# Patient Record
Sex: Female | Born: 1970 | Race: Black or African American | Hispanic: No | Marital: Single | Smoking: Current every day smoker
Health system: Southern US, Community
[De-identification: ages and names within clinical notes are randomized; demographics above are authoritative.]

## PROBLEM LIST (undated history)

## (undated) DIAGNOSIS — R0789 Other chest pain: Secondary | ICD-10-CM

## (undated) DIAGNOSIS — I10 Essential (primary) hypertension: Secondary | ICD-10-CM

## (undated) DIAGNOSIS — Z720 Tobacco use: Secondary | ICD-10-CM

## (undated) DIAGNOSIS — E785 Hyperlipidemia, unspecified: Secondary | ICD-10-CM

## (undated) DIAGNOSIS — I5189 Other ill-defined heart diseases: Secondary | ICD-10-CM

## (undated) HISTORY — DX: Tobacco use: Z72.0

## (undated) HISTORY — DX: Other chest pain: R07.89

## (undated) HISTORY — DX: Other ill-defined heart diseases: I51.89

---

## 2004-11-01 ENCOUNTER — Emergency Department: Payer: Self-pay | Admitting: General Practice

## 2005-01-22 ENCOUNTER — Observation Stay: Payer: Self-pay | Admitting: Obstetrics & Gynecology

## 2005-03-17 ENCOUNTER — Observation Stay: Payer: Self-pay | Admitting: Unknown Physician Specialty

## 2005-04-15 ENCOUNTER — Observation Stay: Payer: Self-pay

## 2005-04-24 ENCOUNTER — Observation Stay: Payer: Self-pay

## 2005-05-14 ENCOUNTER — Observation Stay: Payer: Self-pay | Admitting: Obstetrics & Gynecology

## 2005-05-15 ENCOUNTER — Inpatient Hospital Stay: Payer: Self-pay

## 2006-10-22 ENCOUNTER — Emergency Department: Payer: Self-pay | Admitting: Emergency Medicine

## 2011-09-16 ENCOUNTER — Ambulatory Visit: Payer: Self-pay

## 2013-07-17 ENCOUNTER — Emergency Department: Payer: Self-pay | Admitting: Emergency Medicine

## 2013-07-17 LAB — CBC WITH DIFFERENTIAL/PLATELET
BASOS PCT: 0.5 %
Basophil #: 0 10*3/uL (ref 0.0–0.1)
EOS PCT: 0.2 %
Eosinophil #: 0 10*3/uL (ref 0.0–0.7)
HCT: 44.9 % (ref 35.0–47.0)
HGB: 15 g/dL (ref 12.0–16.0)
LYMPHS PCT: 41.8 %
Lymphocyte #: 2.2 10*3/uL (ref 1.0–3.6)
MCH: 30.5 pg (ref 26.0–34.0)
MCHC: 33.5 g/dL (ref 32.0–36.0)
MCV: 91 fL (ref 80–100)
Monocyte #: 0.7 x10 3/mm (ref 0.2–0.9)
Monocyte %: 12.6 %
NEUTROS ABS: 2.4 10*3/uL (ref 1.4–6.5)
NEUTROS PCT: 44.9 %
PLATELETS: 122 10*3/uL — AB (ref 150–440)
RBC: 4.93 10*6/uL (ref 3.80–5.20)
RDW: 13.7 % (ref 11.5–14.5)
WBC: 5.4 10*3/uL (ref 3.6–11.0)

## 2013-07-17 LAB — URINALYSIS, COMPLETE
Bilirubin,UR: NEGATIVE
Glucose,UR: NEGATIVE mg/dL (ref 0–75)
Ketone: NEGATIVE
Leukocyte Esterase: NEGATIVE
Nitrite: POSITIVE
PH: 5 (ref 4.5–8.0)
Protein: 100
Specific Gravity: 1.014 (ref 1.003–1.030)
Squamous Epithelial: 3

## 2013-07-17 LAB — COMPREHENSIVE METABOLIC PANEL
ALBUMIN: 3.7 g/dL (ref 3.4–5.0)
ALK PHOS: 45 U/L
AST: 27 U/L (ref 15–37)
Anion Gap: 2 — ABNORMAL LOW (ref 7–16)
BUN: 5 mg/dL — ABNORMAL LOW (ref 7–18)
Bilirubin,Total: 0.6 mg/dL (ref 0.2–1.0)
CALCIUM: 8.3 mg/dL — AB (ref 8.5–10.1)
CO2: 30 mmol/L (ref 21–32)
Chloride: 104 mmol/L (ref 98–107)
Creatinine: 0.76 mg/dL (ref 0.60–1.30)
EGFR (Non-African Amer.): >60
Glucose: 98 mg/dL (ref 65–99)
Osmolality: 269 (ref 275–301)
Potassium: 3.5 mmol/L (ref 3.5–5.1)
SGPT (ALT): 17 U/L (ref 12–78)
Sodium: 136 mmol/L (ref 136–145)
Total Protein: 7.7 g/dL (ref 6.4–8.2)

## 2013-07-17 LAB — LIPASE, BLOOD: LIPASE: 158 U/L (ref 73–393)

## 2013-07-20 LAB — URINE CULTURE

## 2013-11-10 ENCOUNTER — Emergency Department: Payer: Self-pay | Admitting: Emergency Medicine

## 2013-11-10 LAB — URINALYSIS, COMPLETE
Bilirubin,UR: NEGATIVE
Blood: NEGATIVE
Glucose,UR: NEGATIVE mg/dL (ref 0–75)
KETONE: NEGATIVE
LEUKOCYTE ESTERASE: NEGATIVE
Nitrite: NEGATIVE
PH: 6 (ref 4.5–8.0)
PROTEIN: NEGATIVE
RBC,UR: NONE SEEN /HPF (ref 0–5)
SPECIFIC GRAVITY: 1.014 (ref 1.003–1.030)
WBC UR: 4 /HPF (ref 0–5)

## 2013-11-10 LAB — CBC
HCT: 38.2 % (ref 35.0–47.0)
HGB: 12.9 g/dL (ref 12.0–16.0)
MCH: 30.7 pg (ref 26.0–34.0)
MCHC: 33.7 g/dL (ref 32.0–36.0)
MCV: 91 fL (ref 80–100)
Platelet: 215 10*3/uL (ref 150–440)
RBC: 4.19 10*6/uL (ref 3.80–5.20)
RDW: 14.2 % (ref 11.5–14.5)
WBC: 7.6 10*3/uL (ref 3.6–11.0)

## 2013-11-10 LAB — COMPREHENSIVE METABOLIC PANEL
ALBUMIN: 3.6 g/dL (ref 3.4–5.0)
Alkaline Phosphatase: 99 U/L
Anion Gap: 5 — ABNORMAL LOW (ref 7–16)
BUN: 8 mg/dL (ref 7–18)
Bilirubin,Total: 0.5 mg/dL (ref 0.2–1.0)
CO2: 27 mmol/L (ref 21–32)
Calcium, Total: 8.7 mg/dL (ref 8.5–10.1)
Chloride: 108 mmol/L — ABNORMAL HIGH (ref 98–107)
Creatinine: 0.93 mg/dL (ref 0.60–1.30)
EGFR (African American): >60
EGFR (Non-African Amer.): >60
GLUCOSE: 69 mg/dL (ref 65–99)
OSMOLALITY: 276 (ref 275–301)
Potassium: 3.8 mmol/L (ref 3.5–5.1)
SGOT(AST): 23 U/L (ref 15–37)
SGPT (ALT): 18 U/L (ref 12–78)
Sodium: 140 mmol/L (ref 136–145)
TOTAL PROTEIN: 6.9 g/dL (ref 6.4–8.2)

## 2013-11-10 LAB — PREGNANCY, URINE: PREGNANCY TEST, URINE: NEGATIVE m[IU]/mL

## 2013-11-10 LAB — WET PREP, GENITAL

## 2013-11-10 LAB — GC/CHLAMYDIA PROBE AMP

## 2013-11-10 LAB — LIPASE, BLOOD: Lipase: 176 U/L (ref 73–393)

## 2015-06-20 ENCOUNTER — Emergency Department
Admission: EM | Admit: 2015-06-20 | Discharge: 2015-06-20 | Disposition: A | Discharge status: Home / Self Care | Payer: BLUE CROSS/BLUE SHIELD | Attending: Emergency Medicine | Admitting: Emergency Medicine

## 2015-06-20 ENCOUNTER — Encounter: Payer: Self-pay | Admitting: Emergency Medicine

## 2015-06-20 DIAGNOSIS — I10 Essential (primary) hypertension: Secondary | ICD-10-CM | POA: Diagnosis not present

## 2015-06-20 DIAGNOSIS — F17210 Nicotine dependence, cigarettes, uncomplicated: Secondary | ICD-10-CM | POA: Diagnosis not present

## 2015-06-20 DIAGNOSIS — R251 Tremor, unspecified: Secondary | ICD-10-CM | POA: Diagnosis present

## 2015-06-20 DIAGNOSIS — F419 Anxiety disorder, unspecified: Secondary | ICD-10-CM | POA: Insufficient documentation

## 2015-06-20 LAB — BASIC METABOLIC PANEL
Anion gap: 3 — ABNORMAL LOW (ref 5–15)
BUN: 9 mg/dL (ref 6–20)
CO2: 29 mmol/L (ref 22–32)
Calcium: 9.2 mg/dL (ref 8.9–10.3)
Chloride: 107 mmol/L (ref 101–111)
Creatinine, Ser: 0.6 mg/dL (ref 0.44–1.00)
GFR calc Af Amer: >60 mL/min (ref >60)
GLUCOSE: 82 mg/dL (ref 65–99)
POTASSIUM: 3.6 mmol/L (ref 3.5–5.1)
Sodium: 139 mmol/L (ref 135–145)

## 2015-06-20 LAB — URINALYSIS COMPLETE WITH MICROSCOPIC (ARMC ONLY)
BILIRUBIN URINE: NEGATIVE
Glucose, UA: NEGATIVE mg/dL
KETONES UR: NEGATIVE mg/dL
NITRITE: NEGATIVE
Protein, ur: NEGATIVE mg/dL
Specific Gravity, Urine: 1.01 (ref 1.005–1.030)
pH: 6 (ref 5.0–8.0)

## 2015-06-20 LAB — CBC
HEMATOCRIT: 39 % (ref 35.0–47.0)
Hemoglobin: 13 g/dL (ref 12.0–16.0)
MCH: 30.3 pg (ref 26.0–34.0)
MCHC: 33.4 g/dL (ref 32.0–36.0)
MCV: 90.5 fL (ref 80.0–100.0)
Platelets: 215 10*3/uL (ref 150–440)
RBC: 4.31 MIL/uL (ref 3.80–5.20)
RDW: 13.5 % (ref 11.5–14.5)
WBC: 6.5 10*3/uL (ref 3.6–11.0)

## 2015-06-20 LAB — GLUCOSE, CAPILLARY: GLUCOSE-CAPILLARY: 121 mg/dL — AB (ref 65–99)

## 2015-06-20 MED ORDER — MIRTAZAPINE 15 MG PO TABS: 15.0000 mg | ORAL_TABLET | Freq: Every day | ORAL | Status: DC

## 2015-06-20 MED ORDER — HYDROCHLOROTHIAZIDE 25 MG PO TABS: 25.0000 mg | ORAL_TABLET | Freq: Every day | ORAL | Status: DC

## 2015-06-20 NOTE — ED Notes (Signed)
Today at work ([employer]) states she felt light-headed and started shaking uncontrollably. Patient questioning if it could be her nerves or her blood pressure. States she still feels light headed but the shakiness is going.  States "I don't feel good I'm not sure what it is".

## 2015-06-20 NOTE — Discharge Instructions (Signed)
Panic Attacks °Panic attacks are sudden, short-lived surges of severe anxiety, fear, or discomfort. They may occur for no reason when you are relaxed, when you are anxious, or when you are sleeping. Panic attacks may occur for a number of reasons:  °· Healthy people occasionally have panic attacks in extreme, life-threatening situations, such as war or natural disasters. Normal anxiety is a protective mechanism of the body that helps us react to danger (fight or flight response). °· Panic attacks are often seen with anxiety disorders, such as panic disorder, social anxiety disorder, generalized anxiety disorder, and phobias. Anxiety disorders cause excessive or uncontrollable anxiety. They may interfere with your relationships or other life activities. °· Panic attacks are sometimes seen with other mental illnesses, such as depression and posttraumatic stress disorder. °· Certain medical conditions, prescription medicines, and drugs of abuse can cause panic attacks. °SYMPTOMS  °Panic attacks start suddenly, peak within 20 minutes, and are accompanied by four or more of the following symptoms: °· Pounding heart or fast heart rate (palpitations). °· Sweating. °· Trembling or shaking. °· Shortness of breath or feeling smothered. °· Feeling choked. °· Chest pain or discomfort. °· Nausea or strange feeling in your stomach. °· Dizziness, light-headedness, or feeling like you will faint. °· Chills or hot flushes. °· Numbness or tingling in your lips or hands and feet. °· Feeling that things are not real or feeling that you are not yourself. °· Fear of losing control or going crazy. °· Fear of dying. °Some of these symptoms can mimic serious medical conditions. For example, you may think you are having a heart attack. Although panic attacks can be very scary, they are not life threatening. °DIAGNOSIS  °Panic attacks are diagnosed through an assessment by your health care provider. Your health care provider will ask  questions about your symptoms, such as where and when they occurred. Your health care provider will also ask about your medical history and use of alcohol and drugs, including prescription medicines. Your health care provider may order blood tests or other studies to rule out a serious medical condition. Your health care provider may refer you to a mental health professional for further evaluation. °TREATMENT  °· Most healthy people who have one or two panic attacks in an extreme, life-threatening situation will not require treatment. °· The treatment for panic attacks associated with anxiety disorders or other mental illness typically involves counseling with a mental health professional, medicine, or a combination of both. Your health care provider will help determine what treatment is best for you. °· Panic attacks due to physical illness usually go away with treatment of the illness. If prescription medicine is causing panic attacks, talk with your health care provider about stopping the medicine, decreasing the dose, or substituting another medicine. °· Panic attacks due to alcohol or drug abuse go away with abstinence. Some adults need professional help in order to stop drinking or using drugs. °HOME CARE INSTRUCTIONS  °· Take all medicines as directed by your health care provider.   °· Schedule and attend follow-up visits as directed by your health care provider. It is important to keep all your appointments. °SEEK MEDICAL CARE IF: °· You are not able to take your medicines as prescribed. °· Your symptoms do not improve or get worse. °SEEK IMMEDIATE MEDICAL CARE IF:  °· You experience panic attack symptoms that are different than your usual symptoms. °· You have serious thoughts about hurting yourself or others. °· You are taking medicine for panic attacks and   have a serious side effect. MAKE SURE YOU:  Understand these instructions.  Will watch your condition.  Will get help right away if you are not  doing well or get worse.   This information is not intended to replace advice given to you by your health care provider. Make sure you discuss any questions you have with your health care provider.   Document Released: 05/04/2005 Document Revised: 05/09/2013 Document Reviewed: 12/16/2012 Elsevier Interactive Patient Education 2016 [employer].  Hypertension Hypertension is another name for high blood pressure. High blood pressure forces your heart to work harder to pump blood. A blood pressure reading has two numbers, which includes a higher number over a lower number (example: 110/72). HOME CARE   Have your blood pressure rechecked by your doctor.  Only take medicine as told by your doctor. Follow the directions carefully. The medicine does not work as well if you skip doses. Skipping doses also puts you at risk for problems.  Do not smoke.  Monitor your blood pressure at home as told by your doctor. GET HELP IF:  You think you are having a reaction to the medicine you are taking.  You have repeat headaches or feel dizzy.  You have puffiness (swelling) in your ankles.  You have trouble with your vision. GET HELP RIGHT AWAY IF:   You get a very bad headache and are confused.  You feel weak, numb, or faint.  You get chest or belly (abdominal) pain.  You throw up (vomit).  You cannot breathe very well. MAKE SURE YOU:   Understand these instructions.  Will watch your condition.  Will get help right away if you are not doing well or get worse.   This information is not intended to replace advice given to you by your health care provider. Make sure you discuss any questions you have with your health care provider.   Document Released: 10/21/2007 Document Revised: 05/09/2013 Document Reviewed: 02/24/2013 Elsevier Interactive Patient Education [employer]

## 2015-06-20 NOTE — ED Notes (Signed)
FSBS 121

## 2015-06-20 NOTE — ED Provider Notes (Signed)
[facility] Emergency Department Provider Note     Time seen: ----------------------------------------- 2:45 PM on 06/20/2015 -----------------------------------------    I have reviewed the triage vital signs and the nursing notes.   HISTORY  Chief Complaint Shaking    HPI Ana Cannon is a 45 y.o. female who presents ER for lightheadedness and shaking at work. Patient states it could be her nerves or blood pressure. Shestates she felt lightheaded but the shakiness was going away. Patient states she just didn't feel the patient is not sure what exactly was wrong. She does note being under increased stress due to financial concerns, denies any recent illness.   History reviewed. No pertinent past medical history.  There are no active problems to display for this patient.   History reviewed. No pertinent past surgical history.  Allergies Review of patient's allergies indicates no known allergies.  Social History Social History  Substance Use Topics  . Smoking status: Current Every Day Smoker -- 0.50 packs/day    Types: Cigarettes  . Smokeless tobacco: None  . Alcohol Use: No    Review of Systems Constitutional: Negative for fever. Eyes: Negative for visual changes. ENT: Negative for sore throat. Cardiovascular: Negative for chest pain. Respiratory: Negative for shortness of breath. Gastrointestinal: Negative for abdominal pain, vomiting and diarrhea. Genitourinary: Negative for dysuria. Musculoskeletal: Negative for back pain. Skin: Negative for rash. Neurological: Negative for headaches, focal weakness or numbness. Positive for dizziness  10-point ROS otherwise negative.  ____________________________________________   PHYSICAL EXAM:  VITAL SIGNS: ED Triage Vitals  Enc Vitals Group     BP 06/20/15 1122 144/93 mmHg     Pulse Rate 06/20/15 1122 83     Resp --      Temp 06/20/15 1122 98.5 F (36.9 C)     Temp Source  06/20/15 1122 Oral     SpO2 06/20/15 1122 100 %     Weight 06/20/15 1122 128 lb (58.06 kg)     Height 06/20/15 1122  (1.702 m)     Head Cir --      Peak Flow --      Pain Score --      Pain Loc --      Pain Edu? --      Excl. in GC? --     Constitutional: Alert and oriented. Well appearing and in no distress. Eyes: Conjunctivae are normal. PERRL. Normal extraocular movements. ENT   Head: Normocephalic and atraumatic.   Nose: No congestion/rhinnorhea.   Mouth/Throat: Mucous membranes are moist.   Neck: No stridor. Cardiovascular: Normal rate, regular rhythm. Normal and symmetric distal pulses are present in all extremities. No murmurs, rubs, or gallops. Respiratory: Normal respiratory effort without tachypnea nor retractions. Breath sounds are clear and equal bilaterally. No wheezes/rales/rhonchi. Gastrointestinal: Soft and nontender. No distention. No abdominal bruits.  Musculoskeletal: Nontender with normal range of motion in all extremities. No joint effusions.  No lower extremity tenderness nor edema. Neurologic:  Normal speech and language. No gross focal neurologic deficits are appreciated. Speech is normal. No gait instability. Skin:  Skin is warm, dry and intact. No rash noted. Psychiatric: Mood and affect are normal. Speech and behavior are normal. Patient exhibits appropriate insight and judgment. ____________________________________________  EKG: Interpreted by me. Normal sinus rhythm with normal axis normal intervals, no evidence of hypertrophy or acute infarction. Rate is 70 bpm  ____________________________________________  ED COURSE:  Pertinent labs & imaging results that were available during my care of the patient  were reviewed by me and considered in my medical decision making (see chart for details). Patient evidently with anxiety or stress related event. She likely has long-standing mild hypertension as  well. ____________________________________________    LABS (pertinent positives/negatives)  Labs Reviewed  GLUCOSE, CAPILLARY - Abnormal; Notable for the following:    Glucose-Capillary 121 (*)    All other components within normal limits  BASIC METABOLIC PANEL - Abnormal; Notable for the following:    Anion gap 3 (*)    All other components within normal limits  URINALYSIS COMPLETEWITH MICROSCOPIC ([facility] ONLY) - Abnormal; Notable for the following:    Color, Urine YELLOW (*)    APPearance CLOUDY (*)    Hgb urine dipstick 1+ (*)    Leukocytes, UA TRACE (*)    Bacteria, UA RARE (*)    Squamous Epithelial / LPF 6-30 (*)    All other components within normal limits  CBC  CBG MONITORING, ED    ____________________________________________  FINAL ASSESSMENT AND PLAN  Anxiety, hypertension  Plan: Patient with labs and imaging as dictated above. Patient is no acute distress, I will start her back on her Remeron that she is to take for mood related problems and depression. She will also be placed on HCTZ for her blood pressure, on my check it was 150/105. Stable for outpatient follow-up with her doctor.   Emily Filbert, MD   Emily Filbert, MD 06/20/15 [phone]

## 2016-12-13 ENCOUNTER — Encounter: Payer: Self-pay | Admitting: Emergency Medicine

## 2016-12-13 ENCOUNTER — Emergency Department
Admission: EM | Admit: 2016-12-13 | Discharge: 2016-12-14 | Disposition: A | Discharge status: Home / Self Care | Payer: BLUE CROSS/BLUE SHIELD | Attending: Emergency Medicine | Admitting: Emergency Medicine

## 2016-12-13 DIAGNOSIS — I10 Essential (primary) hypertension: Secondary | ICD-10-CM | POA: Diagnosis not present

## 2016-12-13 DIAGNOSIS — F17210 Nicotine dependence, cigarettes, uncomplicated: Secondary | ICD-10-CM | POA: Insufficient documentation

## 2016-12-13 DIAGNOSIS — R55 Syncope and collapse: Secondary | ICD-10-CM

## 2016-12-13 DIAGNOSIS — N390 Urinary tract infection, site not specified: Secondary | ICD-10-CM | POA: Diagnosis not present

## 2016-12-13 DIAGNOSIS — Z79899 Other long term (current) drug therapy: Secondary | ICD-10-CM | POA: Insufficient documentation

## 2016-12-13 HISTORY — DX: Essential (primary) hypertension: I10

## 2016-12-13 HISTORY — DX: Hyperlipidemia, unspecified: E78.5

## 2016-12-13 LAB — BASIC METABOLIC PANEL
Anion gap: 7 (ref 5–15)
BUN: 14 mg/dL (ref 6–20)
CO2: 27 mmol/L (ref 22–32)
Calcium: 9.4 mg/dL (ref 8.9–10.3)
Chloride: 102 mmol/L (ref 101–111)
Creatinine, Ser: 1.28 mg/dL — ABNORMAL HIGH (ref 0.44–1.00)
GFR calc Af Amer: 58 mL/min — ABNORMAL LOW (ref >60)
GFR calc non Af Amer: 50 mL/min — ABNORMAL LOW (ref >60)
Glucose, Bld: 126 mg/dL — ABNORMAL HIGH (ref 65–99)
Potassium: 3.3 mmol/L — ABNORMAL LOW (ref 3.5–5.1)
SODIUM: 136 mmol/L (ref 135–145)

## 2016-12-13 LAB — URINALYSIS, COMPLETE (UACMP) WITH MICROSCOPIC
BILIRUBIN URINE: NEGATIVE
Glucose, UA: NEGATIVE mg/dL
Ketones, ur: NEGATIVE mg/dL
Nitrite: POSITIVE — AB
PH: 5 (ref 5.0–8.0)
Protein, ur: NEGATIVE mg/dL
SPECIFIC GRAVITY, URINE: 1.012 (ref 1.005–1.030)

## 2016-12-13 LAB — CBC
HCT: 42.7 % (ref 35.0–47.0)
Hemoglobin: 14.6 g/dL (ref 12.0–16.0)
MCH: 30 pg (ref 26.0–34.0)
MCHC: 34.2 g/dL (ref 32.0–36.0)
MCV: 87.7 fL (ref 80.0–100.0)
PLATELETS: 275 10*3/uL (ref 150–440)
RBC: 4.88 MIL/uL (ref 3.80–5.20)
RDW: 13.9 % (ref 11.5–14.5)
WBC: 10.1 10*3/uL (ref 3.6–11.0)

## 2016-12-13 LAB — POCT PREGNANCY, URINE: PREG TEST UR: NEGATIVE

## 2016-12-13 LAB — GLUCOSE, CAPILLARY: Glucose-Capillary: 89 mg/dL (ref 65–99)

## 2016-12-13 MED ORDER — DEXTROSE 5 % IV SOLN
1.0000 g | Freq: Once | INTRAVENOUS | Status: AC
  Administered 2016-12-13: 1 g via INTRAVENOUS
  Filled 2016-12-13: qty 10

## 2016-12-13 MED ORDER — SODIUM CHLORIDE 0.9 % IV BOLUS (SEPSIS)
1000.0000 mL | Freq: Once | INTRAVENOUS | Status: AC
  Administered 2016-12-13: 1000 mL via INTRAVENOUS

## 2016-12-13 MED ORDER — CEPHALEXIN 500 MG PO CAPS: 500.0000 mg | ORAL_CAPSULE | Freq: Three times a day (TID) | ORAL | 0 refills | Status: DC

## 2016-12-13 NOTE — Discharge Instructions (Signed)
Please seek medical attention for any high fevers, chest pain, shortness of breath, change in behavior, persistent vomiting, bloody stool or any other new or concerning symptoms.  

## 2016-12-13 NOTE — ED Provider Notes (Signed)
[facility] Emergency Department Provider Note   ____________________________________________   I have reviewed the triage vital signs and the nursing notes.   HISTORY  Chief Complaint Loss of Consciousness   History limited by: Not Limited   HPI Ana Cannon is a 46 y.o. female who presents to the emergency department today because of concerns for syncopal episode. The patient states that throughout the day she had been drinking some Powerade. It does not sound like she ate. This afternoon she did have one episode after drinking half of beer when she started to feel lightheaded and felt like she might pass out. She did not however. Shortly thereafter she did have a true syncopal episode. She denies any chest pain or palpitations. She denies any recent illness or fevers. She does state however that she recently started a blood pressure medication a few days ago.   Past Medical History:  Diagnosis Date  . Hyperlipemia   . Hypertension     There are no active problems to display for this patient.   History reviewed. No pertinent surgical history.  Prior to Admission medications   Medication Sig Start Date End Date Taking? Authorizing Provider  hydrochlorothiazide (HYDRODIURIL) 25 MG tablet Take 1 tablet (25 mg total) by mouth daily. 06/20/15   Emily FilbertWilliams, Jonathan E, MD  mirtazapine (REMERON) 15 MG tablet Take 1 tablet (15 mg total) by mouth at bedtime. 06/20/15 06/19/16  Emily FilbertWilliams, Jonathan E, MD    Allergies Patient has no known allergies.  No family history on file.  Social History Social History  Substance Use Topics  . Smoking status: Current Every Day Smoker    Packs/day: 0.50    Types: Cigarettes  . Smokeless tobacco: Never Used  . Alcohol use Yes    Review of Systems Constitutional: No fever/chills Eyes: No visual changes. ENT: No sore throat. Cardiovascular: Denies chest pain. Respiratory: Denies shortness of  breath. Gastrointestinal: No abdominal pain.  No nausea, no vomiting.  No diarrhea.   Genitourinary: Negative for dysuria. Musculoskeletal: Negative for back pain. Skin: Negative for rash. Neurological: Negative for headaches, focal weakness or numbness.  ____________________________________________   PHYSICAL EXAM:  VITAL SIGNS: ED Triage Vitals  Enc Vitals Group     BP 12/13/16 2055 (!) 143/123     Pulse Rate 12/13/16 2055 90     Resp 12/13/16 2055 18     Temp 12/13/16 2055 97.9 F (36.6 C)     Temp Source 12/13/16 2055 Oral     SpO2 12/13/16 2055 98 %     Weight 12/13/16 2056 163 lb (73.9 kg)     Height 12/13/16 2056 5\' 7"  (1.702 m)   Constitutional: Alert and oriented. Well appearing and in no distress. Eyes: Conjunctivae are normal.  ENT   Head: Normocephalic and atraumatic.   Nose: No congestion/rhinnorhea.   Mouth/Throat: Mucous membranes are moist.   Neck: No stridor. Hematological/Lymphatic/Immunilogical: No cervical lymphadenopathy. Cardiovascular: Normal rate, regular rhythm.  No murmurs, rubs, or gallops.  Respiratory: Normal respiratory effort without tachypnea nor retractions. Breath sounds are clear and equal bilaterally. No wheezes/rales/rhonchi. Gastrointestinal: Soft and non tender. No rebound. No guarding.  Genitourinary: Deferred Musculoskeletal: Normal range of motion in all extremities. No lower extremity edema. Neurologic:  Normal speech and language. No gross focal neurologic deficits are appreciated.  Skin:  Skin is warm, dry and intact. No rash noted. Psychiatric: Mood and affect are normal. Speech and behavior are normal. Patient exhibits appropriate insight and judgment.  ____________________________________________  LABS (pertinent positives/negatives)  Labs Reviewed  BASIC METABOLIC PANEL - Abnormal; Notable for the following:       Result Value   Potassium 3.3 (*)    Glucose, Bld 126 (*)    Creatinine, Ser 1.28 (*)     GFR calc non Af Amer 50 (*)    GFR calc Af Amer 58 (*)    All other components within normal limits  URINALYSIS, COMPLETE (UACMP) WITH MICROSCOPIC - Abnormal; Notable for the following:    Color, Urine YELLOW (*)    APPearance HAZY (*)    Hgb urine dipstick SMALL (*)    Nitrite POSITIVE (*)    Leukocytes, UA TRACE (*)    Bacteria, UA MANY (*)    Squamous Epithelial / LPF 0-5 (*)    All other components within normal limits  CBC  GLUCOSE, CAPILLARY  CBG MONITORING, ED  POCT PREGNANCY, URINE     ____________________________________________   EKG  I, Ana SemenGraydon Jazmynn Pho, attending physician, personally viewed and interpreted this EKG  EKG Time: 2107 Rate: 97 Rhythm: normal sinus rhythm Axis: normal Intervals: qtc 474 QRS: narrow, q waves V1 ST changes: no st elevation Impression: abnormal ekg   ____________________________________________    RADIOLOGY  None  ____________________________________________   PROCEDURES  Procedures  ____________________________________________   INITIAL IMPRESSION / ASSESSMENT AND PLAN / ED COURSE  Pertinent labs & imaging results that were available during my care of the patient were reviewed by me and considered in my medical decision making (see chart for details).  Patient presents to the emergency department today after a syncopal episode. I think this is likely multifactorial. I do think patient is slightly dehydrated given elevation of creatinine. Additionally patient has evidence of a urinary tract infection. Additionally she just restarted a blood pressure medication. The patient did feel better after IV fluids. Was also given antibiotics here in the emergency department. Will discharge with further antibiotics.   ____________________________________________   FINAL CLINICAL IMPRESSION(S) / ED DIAGNOSES  Final diagnoses:  Syncope, unspecified syncope type  Lower urinary tract infectious disease     Note: This  dictation was prepared with Dragon dictation. Any transcriptional errors that result from this process are unintentional     Ana Cannon, Ana Chabot, MD 12/14/16 [phone]

## 2016-12-13 NOTE — ED Triage Notes (Signed)
Patient states that she had 2 syncopal episodes today and became diaphoretic.

## 2018-01-11 ENCOUNTER — Emergency Department
Admission: EM | Admit: 2018-01-11 | Discharge: 2018-01-11 | Disposition: A | Discharge status: Home / Self Care | Payer: BLUE CROSS/BLUE SHIELD | Attending: Emergency Medicine | Admitting: Emergency Medicine

## 2018-01-11 ENCOUNTER — Other Ambulatory Visit: Payer: Self-pay

## 2018-01-11 DIAGNOSIS — I10 Essential (primary) hypertension: Secondary | ICD-10-CM | POA: Diagnosis not present

## 2018-01-11 DIAGNOSIS — R079 Chest pain, unspecified: Secondary | ICD-10-CM | POA: Diagnosis present

## 2018-01-11 DIAGNOSIS — R0789 Other chest pain: Secondary | ICD-10-CM | POA: Diagnosis not present

## 2018-01-11 DIAGNOSIS — F17210 Nicotine dependence, cigarettes, uncomplicated: Secondary | ICD-10-CM | POA: Diagnosis not present

## 2018-01-11 DIAGNOSIS — Z79899 Other long term (current) drug therapy: Secondary | ICD-10-CM | POA: Insufficient documentation

## 2018-01-11 LAB — CBC WITH DIFFERENTIAL/PLATELET
BASOS ABS: 0.1 10*3/uL (ref 0–0.1)
Basophils Relative: 1 %
EOS ABS: 0.4 10*3/uL (ref 0–0.7)
EOS PCT: 3 %
HCT: 36.2 % (ref 35.0–47.0)
Hemoglobin: 12.2 g/dL (ref 12.0–16.0)
Lymphocytes Relative: 24 %
Lymphs Abs: 2.6 10*3/uL (ref 1.0–3.6)
MCH: 31.4 pg (ref 26.0–34.0)
MCHC: 33.8 g/dL (ref 32.0–36.0)
MCV: 92.9 fL (ref 80.0–100.0)
Monocytes Absolute: 0.9 10*3/uL (ref 0.2–0.9)
Monocytes Relative: 8 %
Neutro Abs: 7.3 10*3/uL — ABNORMAL HIGH (ref 1.4–6.5)
Neutrophils Relative %: 64 %
PLATELETS: 259 10*3/uL (ref 150–440)
RBC: 3.89 MIL/uL (ref 3.80–5.20)
RDW: 13.9 % (ref 11.5–14.5)
WBC: 11.3 10*3/uL — AB (ref 3.6–11.0)

## 2018-01-11 LAB — COMPREHENSIVE METABOLIC PANEL
ALBUMIN: 3.9 g/dL (ref 3.5–5.0)
ALK PHOS: 53 U/L (ref 38–126)
ALT: 11 U/L (ref 0–44)
ANION GAP: 5 (ref 5–15)
AST: 15 U/L (ref 15–41)
BILIRUBIN TOTAL: 0.7 mg/dL (ref 0.3–1.2)
BUN: 8 mg/dL (ref 6–20)
CALCIUM: 9 mg/dL (ref 8.9–10.3)
CO2: 27 mmol/L (ref 22–32)
Chloride: 106 mmol/L (ref 98–111)
Creatinine, Ser: 0.57 mg/dL (ref 0.44–1.00)
GLUCOSE: 101 mg/dL — AB (ref 70–99)
Potassium: 3.7 mmol/L (ref 3.5–5.1)
Sodium: 138 mmol/L (ref 135–145)
TOTAL PROTEIN: 7.3 g/dL (ref 6.5–8.1)

## 2018-01-11 LAB — LIPASE, BLOOD: LIPASE: 25 U/L (ref 11–51)

## 2018-01-11 LAB — TROPONIN I

## 2018-01-11 MED ORDER — METOCLOPRAMIDE HCL 10 MG PO TABS: 10.0000 mg | ORAL_TABLET | Freq: Four times a day (QID) | ORAL | 0 refills | Status: DC | PRN

## 2018-01-11 MED ORDER — GI COCKTAIL ~~LOC~~
30.0000 mL | ORAL | Status: AC
  Administered 2018-01-11: 30 mL via ORAL
  Filled 2018-01-11: qty 30

## 2018-01-11 MED ORDER — FAMOTIDINE 20 MG PO TABS: 20.0000 mg | ORAL_TABLET | Freq: Two times a day (BID) | ORAL | 0 refills | Status: DC

## 2018-01-11 MED ORDER — ASPIRIN 81 MG PO CHEW: 324.0000 mg | CHEWABLE_TABLET | Freq: Once | ORAL | Status: DC

## 2018-01-11 MED ORDER — FAMOTIDINE 20 MG PO TABS
40.0000 mg | ORAL_TABLET | Freq: Once | ORAL | Status: AC
  Administered 2018-01-11: 40 mg via ORAL
  Filled 2018-01-11: qty 2

## 2018-01-11 MED ORDER — ALUMINUM-MAGNESIUM-SIMETHICONE 200-200-20 MG/5ML PO SUSP: 30.0000 mL | Freq: Three times a day (TID) | ORAL | 0 refills | Status: DC

## 2018-01-11 MED ORDER — SODIUM CHLORIDE 0.9 % IV BOLUS
1000.0000 mL | Freq: Once | INTRAVENOUS | Status: AC
  Administered 2018-01-11: 1000 mL via INTRAVENOUS

## 2018-01-11 NOTE — ED Provider Notes (Addendum)
[facility] Emergency Department Provider Note  ____________________________________________  Time seen: Approximately 7:22 PM  I have reviewed the triage vital signs and the nursing notes.   HISTORY  Chief Complaint Chest Pain    HPI Ana Cannon is a 47 y.o. female with a history of hypertension and depression who comes to the ED complaining of chest pain that started at 5:00 PM today.  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  Today she was just starting a break when the pain started.'s in the center of the chest, described as "just there".  Nonradiating, no associated shortness of breath diaphoresis or vomiting.  No dizziness syncope or palpitations.  Gradual onset and worsening, constant, no aggravating or alleviating factors.  Not exertional, not pleuritic.  No recent fevers chills or cough.  No runny nose or sore throat.  No trauma.  In the past this pain is been something that goes away on its own after she stops and tries to relax for a few minutes.      Past Medical History:  Diagnosis Date  . Hyperlipemia   . Hypertension      There are no active problems to display for this patient.    History reviewed. No pertinent surgical history.   Prior to Admission medications   Medication Sig Start Date End Date Taking? Authorizing Provider  mirtazapine (REMERON) 15 MG tablet Take 1 tablet (15 mg total) by mouth at bedtime. Patient taking differently: Take 45 mg by mouth at bedtime.  06/20/15 01/11/18 Yes Emily Filbert, MD  aluminum-magnesium hydroxide-simethicone (MAALOX) 200-200-20 MG/5ML SUSP Take 30 mLs by mouth 4 (four) times daily -  before meals and at bedtime. 01/11/18   Sharman Cheek, MD  cephALEXin (KEFLEX) 500 MG capsule Take 1 capsule (500 mg total) by mouth 3 (three) times daily. Patient not taking: Reported on  01/11/2018 12/13/16   Phineas Semen, MD  famotidine (PEPCID) 20 MG tablet Take 1 tablet (20 mg total) by mouth 2 (two) times daily. 01/11/18   Sharman Cheek, MD  hydrochlorothiazide (HYDRODIURIL) 25 MG tablet Take 1 tablet (25 mg total) by mouth daily. Patient not taking: Reported on 01/11/2018 06/20/15   Emily Filbert, MD  metoCLOPramide (REGLAN) 10 MG tablet Take 1 tablet (10 mg total) by mouth every 6 (six) hours as needed. 01/11/18   Sharman Cheek, MD     Allergies Patient has no known allergies.   History reviewed. No pertinent family history.  Social History Social History   Tobacco Use  . Smoking status: Current Every Day Smoker    Packs/day: 0.50    Types: Cigarettes  . Smokeless tobacco: Never Used  Substance Use Topics  . Alcohol use: Yes  . Drug use: No    Review of Systems  Constitutional:   No fever or chills.  ENT:   No sore throat. No rhinorrhea. Cardiovascular:   Positive as above chest pain without syncope. Respiratory:   No dyspnea or cough. Gastrointestinal:   Negative for abdominal pain, vomiting and diarrhea.  Musculoskeletal:   Negative for focal pain or swelling All other systems reviewed and are negative except as documented above in ROS and HPI.  ____________________________________________   PHYSICAL EXAM:  VITAL SIGNS: ED Triage Vitals  Enc Vitals Group     BP 01/11/18 1852 (!) 150/124     Pulse Rate 01/11/18 1852  87     Resp 01/11/18 1852 16     Temp 01/11/18 1852 98.7 F (37.1 C)     Temp Source 01/11/18 1852 Oral     SpO2 01/11/18 1852 99 %     Weight 01/11/18 1853 128 lb 9.6 oz (58.3 kg)     Height 01/11/18 1853 5\' 7"  (1.702 m)     Head Circumference --      Peak Flow --      Pain Score 01/11/18 1853 7     Pain Loc --      Pain Edu? --      Excl. in GC? --     Vital signs reviewed, nursing assessments reviewed.   Constitutional:   Alert and oriented. Non-toxic appearance. Eyes:   Conjunctivae are normal. EOMI.  PERRL. ENT      Head:   Normocephalic and atraumatic.      Nose:   No congestion/rhinnorhea.       Mouth/Throat:   MMM, no pharyngeal erythema. No peritonsillar mass.       Neck:   No meningismus. Full ROM.  Slightly enlarged thyroid Hematological/Lymphatic/Immunilogical:   No cervical lymphadenopathy. Cardiovascular:   RRR. Symmetric bilateral radial and DP pulses.  No murmurs. Cap refill less than 2 seconds. Respiratory:   Normal respiratory effort without tachypnea/retractions. Breath sounds are clear and equal bilaterally. No wheezes/rales/rhonchi. Gastrointestinal:   Soft with epigastric and left upper quadrant tenderness which reproduces her symptoms.   Non distended. There is no CVA tenderness.  No rebound, rigidity, or guarding.  Musculoskeletal:   Normal range of motion in all extremities. No joint effusions.  No lower extremity tenderness.  No edema.  Chest wall nontender.  Examined with nurse Trula Orehristina present. Neurologic:   Normal speech and language.  Motor grossly intact. No acute focal neurologic deficits are appreciated.  Skin:    Skin is warm, dry and intact. No rash noted.  No petechiae, purpura, or bullae.  ____________________________________________    LABS (pertinent positives/negatives) (all labs ordered are listed, but only abnormal results are displayed) Labs Reviewed  COMPREHENSIVE METABOLIC PANEL - Abnormal; Notable for the following components:      Result Value   Glucose, Bld 101 (*)    All other components within normal limits  CBC WITH DIFFERENTIAL/PLATELET - Abnormal; Notable for the following components:   WBC 11.3 (*)    Neutro Abs 7.3 (*)    All other components within normal limits  LIPASE, BLOOD  TROPONIN I   ____________________________________________   EKG  Interpreted by me  Date: 01/11/2018  Rate: 85  Rhythm: normal sinus rhythm  QRS Axis: normal  Intervals: normal  ST/T Wave abnormalities: normal  Conduction Disutrbances:  none  Narrative Interpretation: unremarkable      ____________________________________________    RADIOLOGY  No results found.  ____________________________________________   PROCEDURES Procedures  ____________________________________________  DIFFERENTIAL DIAGNOSIS   GERD, stress/anxiety, non-STEMI.  CLINICAL IMPRESSION / ASSESSMENT AND PLAN / ED COURSE  Pertinent labs & imaging results that were available during my care of the patient were reviewed by me and considered in my medical decision making (see chart for details).    Patient presents with atypical chest pain.  Doubt ACS PE dissection pneumothorax pneumonia or pericarditis.  She does have risk factors for CAD including hypertension and smoking.  She reports that her doctor discontinued her antihypertensive because her blood pressure was adequately controlled without it.  EMR list hyperlipidemia but she does not take anything for  this.  Not diabetic, no history of CAD or early family history.  Will check labs and chest x-ray.  Most likely a GERD/gastritis issue.  I will give antiacids while awaiting results.  If troponin is negative heart score would be low risk and she would be suitable for discharge home and outpatient follow-up.  Clinical Course as of Jan 12 2016  Tue Jan 11, 2018  1931 Electronic medical record reviewed regarding her primary care clinic assessment at [facility].  May 22 they noted that her weight had increased about 2 pounds from before.  Her weight today is 1 pound higher than it was May 22.  They had planned a outpatient CT chest noncontrast due to her unexplained weight loss but she never went for that study.  They did want her to start omeprazole which she has not started.   [PS]    Clinical Course User Index [PS] Sharman Cheek, MD      ----------------------------------------- 8:16 PM on 01/11/2018 -----------------------------------------  Labs all unremarkable.  EKG was normal.  Heart  score is low risk.  Suitable for discharge home and outpatient follow-up.  ----------------------------------------- 8:42 PM on 01/11/2018 -----------------------------------------  Patient reports that her symptoms have completely resolved after antacids.  She feels back to normal.  Encouraged her to continue following up with her doctor and to do a one-week trial of an acids to see if that improves her symptoms and appetite. ____________________________________________   FINAL CLINICAL IMPRESSION(S) / ED DIAGNOSES    Final diagnoses:  Atypical chest pain     ED Discharge Orders         Ordered    metoCLOPramide (REGLAN) 10 MG tablet  Every 6 hours PRN     01/11/18 2014    famotidine (PEPCID) 20 MG tablet  2 times daily     01/11/18 2014    aluminum-magnesium hydroxide-simethicone (MAALOX) 200-200-20 MG/5ML SUSP  3 times daily before meals & bedtime     01/11/18 2014          Portions of this note were generated with dragon dictation software. Dictation errors may occur despite best attempts at proofreading.    Sharman Cheek, MD 01/11/18 2017    Sharman Cheek, MD 01/11/18 2043

## 2018-01-11 NOTE — Discharge Instructions (Signed)
Your EKG chest x-ray and labs today were unremarkable.

## 2018-01-11 NOTE — ED Triage Notes (Signed)
Pt arrived via EMS from work c/o midline chest pain. Denies N/V. Received nitro and aspirin en route with no relief.

## 2019-05-22 ENCOUNTER — Emergency Department: Payer: BC Managed Care – PPO

## 2019-05-22 ENCOUNTER — Other Ambulatory Visit: Payer: Self-pay

## 2019-05-22 ENCOUNTER — Observation Stay
Admission: EM | Admit: 2019-05-22 | Discharge: 2019-05-23 | Disposition: A | Discharge status: Home / Self Care | Payer: BC Managed Care – PPO | Attending: Internal Medicine | Admitting: Internal Medicine

## 2019-05-22 ENCOUNTER — Encounter: Payer: Self-pay | Admitting: Radiology

## 2019-05-22 DIAGNOSIS — Z79899 Other long term (current) drug therapy: Secondary | ICD-10-CM | POA: Insufficient documentation

## 2019-05-22 DIAGNOSIS — R778 Other specified abnormalities of plasma proteins: Secondary | ICD-10-CM | POA: Diagnosis not present

## 2019-05-22 DIAGNOSIS — I309 Acute pericarditis, unspecified: Principal | ICD-10-CM | POA: Insufficient documentation

## 2019-05-22 DIAGNOSIS — Z20822 Contact with and (suspected) exposure to covid-19: Secondary | ICD-10-CM | POA: Diagnosis not present

## 2019-05-22 DIAGNOSIS — E785 Hyperlipidemia, unspecified: Secondary | ICD-10-CM | POA: Insufficient documentation

## 2019-05-22 DIAGNOSIS — R079 Chest pain, unspecified: Secondary | ICD-10-CM | POA: Diagnosis not present

## 2019-05-22 DIAGNOSIS — I10 Essential (primary) hypertension: Secondary | ICD-10-CM

## 2019-05-22 DIAGNOSIS — R42 Dizziness and giddiness: Secondary | ICD-10-CM | POA: Insufficient documentation

## 2019-05-22 DIAGNOSIS — F419 Anxiety disorder, unspecified: Secondary | ICD-10-CM | POA: Insufficient documentation

## 2019-05-22 DIAGNOSIS — R002 Palpitations: Secondary | ICD-10-CM | POA: Insufficient documentation

## 2019-05-22 DIAGNOSIS — R7989 Other specified abnormal findings of blood chemistry: Secondary | ICD-10-CM | POA: Diagnosis not present

## 2019-05-22 DIAGNOSIS — R000 Tachycardia, unspecified: Secondary | ICD-10-CM | POA: Diagnosis not present

## 2019-05-22 DIAGNOSIS — F17210 Nicotine dependence, cigarettes, uncomplicated: Secondary | ICD-10-CM | POA: Diagnosis not present

## 2019-05-22 LAB — BASIC METABOLIC PANEL
Anion gap: 7 (ref 5–15)
BUN: 11 mg/dL (ref 6–20)
CO2: 26 mmol/L (ref 22–32)
Calcium: 8.8 mg/dL — ABNORMAL LOW (ref 8.9–10.3)
Chloride: 105 mmol/L (ref 98–111)
Creatinine, Ser: 0.71 mg/dL (ref 0.44–1.00)
GFR calc Af Amer: >60 mL/min (ref >60)
GFR calc non Af Amer: >60 mL/min (ref >60)
Glucose, Bld: 94 mg/dL (ref 70–99)
Potassium: 4.1 mmol/L (ref 3.5–5.1)
Sodium: 138 mmol/L (ref 135–145)

## 2019-05-22 LAB — CBC
HCT: 35.5 % — ABNORMAL LOW (ref 36.0–46.0)
Hemoglobin: 12.8 g/dL (ref 12.0–15.0)
MCH: 30.4 pg (ref 26.0–34.0)
MCHC: 36.1 g/dL — ABNORMAL HIGH (ref 30.0–36.0)
MCV: 84.3 fL (ref 80.0–100.0)
Platelets: 275 10*3/uL (ref 150–400)
RBC: 4.21 MIL/uL (ref 3.87–5.11)
RDW: 13.8 % (ref 11.5–15.5)
WBC: 8.6 10*3/uL (ref 4.0–10.5)
nRBC: 0 % (ref 0.0–0.2)

## 2019-05-22 LAB — TROPONIN I (HIGH SENSITIVITY)
Troponin I (High Sensitivity): 22 ng/L — ABNORMAL HIGH (ref <18)
Troponin I (High Sensitivity): 31 ng/L — ABNORMAL HIGH (ref <18)

## 2019-05-22 LAB — D-DIMER, QUANTITATIVE: D-Dimer, Quant: 0.9 ug/mL-FEU — ABNORMAL HIGH (ref 0.00–0.50)

## 2019-05-22 LAB — POCT PREGNANCY, URINE: Preg Test, Ur: NEGATIVE

## 2019-05-22 MED ORDER — IOHEXOL 350 MG/ML SOLN
75.0000 mL | Freq: Once | INTRAVENOUS | Status: AC | PRN
  Administered 2019-05-22: 16:00:00 75 mL via INTRAVENOUS

## 2019-05-22 MED ORDER — SODIUM CHLORIDE 0.9 % IV BOLUS
1000.0000 mL | Freq: Once | INTRAVENOUS | Status: AC
  Administered 2019-05-22: 14:00:00 1000 mL via INTRAVENOUS

## 2019-05-22 MED ORDER — FAMOTIDINE 20 MG PO TABS
20.0000 mg | ORAL_TABLET | Freq: Two times a day (BID) | ORAL | Status: DC
  Administered 2019-05-22 – 2019-05-23 (×2): 20 mg via ORAL
  Filled 2019-05-22 (×2): qty 1

## 2019-05-22 MED ORDER — MIRTAZAPINE 15 MG PO TABS
45.0000 mg | ORAL_TABLET | Freq: Every day | ORAL | Status: DC
  Administered 2019-05-23: 45 mg via ORAL
  Filled 2019-05-22 (×2): qty 1

## 2019-05-22 MED ORDER — ASPIRIN 81 MG PO CHEW
81.0000 mg | CHEWABLE_TABLET | Freq: Every day | ORAL | Status: DC
  Administered 2019-05-22 – 2019-05-23 (×2): 81 mg via ORAL
  Filled 2019-05-22 (×2): qty 1

## 2019-05-22 MED ORDER — ENOXAPARIN SODIUM 40 MG/0.4ML ~~LOC~~ SOLN
40.0000 mg | SUBCUTANEOUS | Status: DC
  Administered 2019-05-23: 12:00:00 40 mg via SUBCUTANEOUS
  Filled 2019-05-22: qty 0.4

## 2019-05-22 MED ORDER — METOPROLOL TARTRATE 25 MG PO TABS
25.0000 mg | ORAL_TABLET | Freq: Two times a day (BID) | ORAL | Status: DC
  Administered 2019-05-22 – 2019-05-23 (×2): 25 mg via ORAL
  Filled 2019-05-22: qty 0.5
  Filled 2019-05-22 (×2): qty 1
  Filled 2019-05-22: qty 0.5

## 2019-05-22 MED ORDER — ASPIRIN 81 MG PO CHEW
324.0000 mg | CHEWABLE_TABLET | Freq: Once | ORAL | Status: AC
  Administered 2019-05-22: 18:00:00 324 mg via ORAL
  Filled 2019-05-22: qty 4

## 2019-05-22 NOTE — ED Provider Notes (Signed)
-----------------------------------------   3:06 PM on 05/22/2019 -----------------------------------------  Blood pressure (!) 139/96, pulse 97, temperature 99.8 F (37.7 C), temperature source Oral, resp. rate 12, height 5\' 7"  (1.702 m), weight 62.1 kg, SpO2 99 %.  Assuming care from Dr. .  In short, Ana Cannon is a 49 y.o. female with a chief complaint of No chief complaint on file. 49  Refer to the original H&P for additional details.  The current plan of care is to follow-up CTA results to assess for PE. Plan for admission regardless of results given rising troponin.  CTA is negative for PE and patient he is currently symptom-free.  Case discussed with hospitalist, who accepts patient for admission.    Marland Kitchen, MD 05/22/19 2356

## 2019-05-22 NOTE — ED Notes (Signed)
Light green and blue tubes sent to lab 

## 2019-05-22 NOTE — ED Triage Notes (Signed)
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

## 2019-05-22 NOTE — ED Notes (Signed)
Went to round on patient and she was not in the room and her belongings were gone. Called pt who states "I went outside to get my phone charger because my phone is about to go dead" pt observed walking past the nurses station as this RN is finishing this note.

## 2019-05-22 NOTE — ED Notes (Signed)
Pt otf for imaging 

## 2019-05-22 NOTE — H&P (Signed)
[place] [facility] [place] NAME: Ana Cannon    MR#:  [id number]  DATE OF BIRTH:  Aug 02, 1970  DATE OF ADMISSION:  05/22/2019  PRIMARY CARE PHYSICIAN: Aldean Jewett, MD   REQUESTING/REFERRING PHYSICIAN: Dr. Charna Archer  Patient coming from : home   CHIEF COMPLAINT:  pain and palpitation  HISTORY OF PRESENT ILLNESS:  Ana Cannon  is a 49 y.o. female with a known history of hypertension currently not on medication comes to the emergency room with complaints of chest pain and palpitation. Patient was at work and started noticing her heart palpitation and fluttering. She came to the emergency room with heart rate of 116.  ED course: patient was found to have tachycardia and elevated blood pressure. She was noted to have troponin of 22 which went up to 31. No cardiac history. Patient is being admitted for chest pain rule out. CT chest was negative for PE.  Cording to the patient she is been going through a lot of stressors at home she is the breadwinner and is still grieving her grandchild's death. She was tearful during my conversation. Denies any chest pain during my evaluation. She is being admitted for further evaluation management  COVID is still pending. Full dose aspirin was given.  PAST MEDICAL HISTORY:   Past Medical History:  Diagnosis Date  . Hyperlipemia   . Hypertension     PAST SURGICAL HISTOIRY:  No past surgical history on file.  SOCIAL HISTORY:   Social History   Tobacco Use  . Smoking status: Current Every Day Smoker    Packs/day: 0.50    Types: Cigarettes  . Smokeless tobacco: Never Used  Substance Use Topics  . Alcohol use: Yes    FAMILY HISTORY:  No family history on file.  DRUG ALLERGIES:  No Known Allergies  REVIEW OF SYSTEMS:  Review of Systems  Constitutional: Negative for chills, fever and weight loss.  HENT: Negative for ear discharge, ear pain and nosebleeds.   Eyes: Negative for blurred  vision, pain and discharge.  Respiratory: Negative for sputum production, shortness of breath, wheezing and stridor.   Cardiovascular: Positive for chest pain and palpitations. Negative for orthopnea and PND.  Gastrointestinal: Negative for abdominal pain, diarrhea, nausea and vomiting.  Genitourinary: Negative for frequency and urgency.  Musculoskeletal: Negative for back pain and joint pain.  Neurological: Negative for sensory change, speech change, focal weakness and weakness.  Psychiatric/Behavioral: Negative for depression and hallucinations. The patient is nervous/anxious.      MEDICATIONS AT HOME:   Prior to Admission medications   Medication Sig Start Date End Date Taking? Authorizing Provider  aluminum-magnesium hydroxide-simethicone (MAALOX) 825-053-97 MG/5ML SUSP Take 30 mLs by mouth 4 (four) times daily -  before meals and at bedtime. 01/11/18   Carrie Mew, MD  cephALEXin (KEFLEX) 500 MG capsule Take 1 capsule (500 mg total) by mouth 3 (three) times daily. Patient not taking: Reported on 01/11/2018 12/13/16   Nance Pear, MD  famotidine (PEPCID) 20 MG tablet Take 1 tablet (20 mg total) by mouth 2 (two) times daily. 01/11/18   Carrie Mew, MD  hydrochlorothiazide (HYDRODIURIL) 25 MG tablet Take 1 tablet (25 mg total) by mouth daily. Patient not taking: Reported on 01/11/2018 06/20/15   Earleen Newport, MD  metoCLOPramide (REGLAN) 10 MG tablet Take 1 tablet (10 mg total) by mouth every 6 (six) hours as needed. 01/11/18   Carrie Mew, MD  mirtazapine (REMERON) 15 MG tablet Take 1 tablet (  15 mg total) by mouth at bedtime. Patient taking differently: Take 45 mg by mouth at bedtime.  06/20/15 01/11/18  Emily Filbert, MD      VITAL SIGNS:  Blood pressure (!) 159/130, pulse 93, temperature 99.8 F (37.7 C), temperature source Oral, resp. rate 20, height 5\' 7"  (1.702 m), weight 62.1 kg, SpO2 100 %.  PHYSICAL EXAMINATION:  GENERAL:  49 y.o.-year-old patient  lying in the bed with no acute distress.  EYES: Pupils equal, round, reactive to light and accommodation. No scleral icterus.  HEENT: Head atraumatic, normocephalic. Oropharynx and nasopharynx clear.  NECK:  Supple, no jugular venous distention. No thyroid enlargement, no tenderness.  LUNGS: Normal breath sounds bilaterally, no wheezing, rales,rhonchi or crepitation. No use of accessory muscles of respiration.  CARDIOVASCULAR: S1, S2 normal. No murmurs, rubs, or gallops.  ABDOMEN: Soft, nontender, nondistended. Bowel sounds present. No organomegaly or mass.  EXTREMITIES: No pedal edema, cyanosis, or clubbing.  NEUROLOGIC: Cranial nerves II through XII are intact. Muscle strength 5/5 in all extremities. Sensation intact. Gait not checked.  PSYCHIATRIC: The patient is alert and oriented x 3. Anxious SKIN: No obvious rash, lesion, or ulcer.   LABORATORY PANEL:   CBC Recent Labs  Lab 05/22/19 1118  WBC 8.6  HGB 12.8  HCT 35.5*  PLT 275   ------------------------------------------------------------------------------------------------------------------  Chemistries  Recent Labs  Lab 05/22/19 1118  NA 138  K 4.1  CL 105  CO2 26  GLUCOSE 94  BUN 11  CREATININE 0.71  CALCIUM 8.8*   ------------------------------------------------------------------------------------------------------------------  Cardiac Enzymes No results for input(s): TROPONINI in the last 168 hours. ------------------------------------------------------------------------------------------------------------------  RADIOLOGY:  DG Chest 2 View  Result Date: 05/22/2019 CLINICAL DATA:  Chest pain, tachycardia EXAM: CHEST - 2 VIEW COMPARISON:  09/16/2011 FINDINGS: The heart size and mediastinal contours are within normal limits. Both lungs are clear. The visualized skeletal structures are unremarkable. IMPRESSION: No active cardiopulmonary disease. Electronically Signed   By: 11/16/2011.  Shick M.D.   On: 05/22/2019 11:42    CT Angio Chest PE W and/or Wo Contrast  Result Date: 05/22/2019 CLINICAL DATA:  Pulmonary embolism. Tachycardia and chest pain. Elevated troponin. EXAM: CT ANGIOGRAPHY CHEST WITH CONTRAST TECHNIQUE: Multidetector CT imaging of the chest was performed using the standard protocol during bolus administration of intravenous contrast. Multiplanar CT image reconstructions and MIPs were obtained to evaluate the vascular anatomy. CONTRAST:  48mL OMNIPAQUE IOHEXOL 350 MG/ML SOLN COMPARISON:  None. FINDINGS: Cardiovascular: Contrast injection is sufficient to demonstrate satisfactory opacification of the pulmonary arteries to the segmental level. There is no pulmonary embolus. The main pulmonary artery is within normal limits for size. There is no CT evidence of acute right heart strain. The visualized aorta is normal. Heart size is borderline enlarged. Mediastinum/Nodes: --No mediastinal or hilar lymphadenopathy. --No axillary lymphadenopathy. --No supraclavicular lymphadenopathy. --Normal thyroid gland. --The esophagus is unremarkable Lungs/Pleura: No pulmonary nodules or masses. No pleural effusion or pneumothorax. No focal airspace consolidation. No focal pleural abnormality. Upper Abdomen: No acute abnormality. Musculoskeletal: No chest wall abnormality. No acute or significant osseous findings. Review of the MIP images confirms the above findings. IMPRESSION: No evidence of pulmonary embolism or other acute intrathoracic process. Electronically Signed   By: 74m M.D.   On: 05/22/2019 16:29    EKG:    IMPRESSION AND PLAN:   Jenan Ellegood  is a 49 y.o. female with a known history of hypertension currently not on medication comes to the emergency room with complaints of chest pain and  palpitation. Patient was at work and started noticing her heart palpitation and fluttering. She came to the emergency room with heart rate of 116.  1. Chest pain with mild elevated troponin rule out MI -appears  likely due to elevated blood pressure with significant amount of stressors at home -patient to telemetry -aspirin 81 mg daily -start metoprolol 25 BID -cardiology consultation with [facility] cardiology. Message sent to Dr. Elease Hashimoto -stress test in the morning  2. Ongoing anxiety with significant stressors at home. -Patient has been taking Remeron she thinks it's not helping her -psychiatry consultation placed. Patient agreeable-- sent to [facility] money NP on call  3. Hypertension -- patient used to be on hydrochlorothiazide which was discontinued by her primary care since her blood pressure was staying lower side -given elevated blood pressure here I have started on metoprolol 25 BID  4. DVT prophylaxis Lovenox   Family Communication : patient Consults : [facility] cardiology, psychiatry Code Status : full DVT prophylaxis : Lovenox  TOTAL TIME TAKING CARE OF THIS PATIENT: *45* minutes.    Enedina Finner M.D on 05/22/2019 at 5:45 PM  Between 7am to 6pm - Pager - [phone]  After 6pm go to [email or web address] - password [email or web address] [facility]    CC: Primary care physician; Gale Journey, MD

## 2019-05-22 NOTE — ED Notes (Signed)
Pt given ginger ale.

## 2019-05-22 NOTE — ED Provider Notes (Signed)
[facility] Emergency Department Provider Note ____________________________________________   First MD Initiated Contact with Patient 05/22/19 1257     (approximate)  I have reviewed the triage vital signs and the nursing notes.   HISTORY  Chief Complaint No chief complaint on file.    HPI Ana Cannon is a 49 y.o. female with PMH as noted below who presents with palpitations, acute onset around 7 AM today when she was at work (patient works as a Glass blower/designer at [employer], doing repetitive movements), and described as a feeling of her heart racing.  She reports associated mild discomfort to the chest but no active chest pain.  She also states she felt lightheaded as if she needed to sit down.  She states that a few months ago, after a death in the family which caused some stress, she had an episode at work in which she felt her blood sugar and blood pressure going low and felt like she was going to pass out, but states that this time was different because of the palpitations.  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  She states that she has had increased stress recently due to that death in the family as well as the COVID-19 crisis, and has been eating and drinking less than she should.   Past Medical History:  Diagnosis Date  . Hyperlipemia   . Hypertension     There are no problems to display for this patient.   No past surgical history on file.  Prior to Admission medications   Medication Sig Start Date End Date Taking? Authorizing Provider  aluminum-magnesium hydroxide-simethicone (MAALOX) 200-200-20 MG/5ML SUSP Take 30 mLs by mouth 4 (four) times daily -  before meals and at bedtime. 01/11/18   Sharman Cheek, MD  cephALEXin (KEFLEX) 500 MG capsule Take 1 capsule (500 mg total) by mouth 3 (three) times daily. Patient not taking: Reported on 01/11/2018  12/13/16   Phineas Semen, MD  famotidine (PEPCID) 20 MG tablet Take 1 tablet (20 mg total) by mouth 2 (two) times daily. 01/11/18   Sharman Cheek, MD  hydrochlorothiazide (HYDRODIURIL) 25 MG tablet Take 1 tablet (25 mg total) by mouth daily. Patient not taking: Reported on 01/11/2018 06/20/15   Emily Filbert, MD  metoCLOPramide (REGLAN) 10 MG tablet Take 1 tablet (10 mg total) by mouth every 6 (six) hours as needed. 01/11/18   Sharman Cheek, MD  mirtazapine (REMERON) 15 MG tablet Take 1 tablet (15 mg total) by mouth at bedtime. Patient taking differently: Take 45 mg by mouth at bedtime.  06/20/15 01/11/18  Emily Filbert, MD    Allergies Patient has no known allergies.  No family history on file.  Social History Social History   Tobacco Use  . Smoking status: Current Every Day Smoker    Packs/day: 0.50    Types: Cigarettes  . Smokeless tobacco: Never Used  Substance Use Topics  . Alcohol use: Yes  . Drug use: No    Review of Systems  Constitutional: No fever. Eyes: No redness. ENT: No sore throat.  Cardiovascular: Positive for palpitations and chest discomfort. Respiratory: Denies shortness of breath. Gastrointestinal: No vomiting or diarrhea diarrhea.  Genitourinary: Negative for dysuria.  Musculoskeletal: Negative for back pain. Skin: Negative for rash. Neurological: Negative for headache.   ____________________________________________   PHYSICAL EXAM:  VITAL SIGNS: ED Triage Vitals  Enc Vitals Group  BP 05/22/19 1105 109/83     Pulse Rate 05/22/19 1105 (!) 116     Resp 05/22/19 1105 18     Temp 05/22/19 1105 99.8 F (37.7 C)     Temp Source 05/22/19 1105 Oral     SpO2 05/22/19 1105 100 %     Weight 05/22/19 1110 137 lb (62.1 kg)     Height 05/22/19 1110 5\' 7"  (1.702 m)     Head Circumference --      Peak Flow --      Pain Score 05/22/19 1110 5     Pain Loc --      Pain Edu? --      Excl. in GC? --     Constitutional: Alert and  oriented. Well appearing and in no acute distress. Eyes: Conjunctivae are normal.  Head: Atraumatic. Nose: No congestion/rhinnorhea. Mouth/Throat: Mucous membranes are moist.   Neck: Normal range of motion.  Cardiovascular: Borderline tachycardia, regular rhythm.   Good peripheral circulation. Respiratory: Normal respiratory effort.  No retractions.  Gastrointestinal: No distention.  Musculoskeletal: No lower extremity edema.  No calf or popliteal swelling or tenderness.  Extremities warm and well perfused.  Neurologic:  Normal speech and language. No gross focal neurologic deficits are appreciated.  Skin:  Skin is warm and dry. No rash noted. Psychiatric: Mood and affect are normal. Speech and behavior are normal.  ____________________________________________   LABS (all labs ordered are listed, but only abnormal results are displayed)  Labs Reviewed  BASIC METABOLIC PANEL - Abnormal; Notable for the following components:      Result Value   Calcium 8.8 (*)    All other components within normal limits  CBC - Abnormal; Notable for the following components:   HCT 35.5 (*)    MCHC 36.1 (*)    All other components within normal limits  TROPONIN I (HIGH SENSITIVITY) - Abnormal; Notable for the following components:   Troponin I (High Sensitivity) 22 (*)    All other components within normal limits  TROPONIN I (HIGH SENSITIVITY) - Abnormal; Notable for the following components:   Troponin I (High Sensitivity) 31 (*)    All other components within normal limits  D-DIMER, QUANTITATIVE (NOT AT [facility])  POC URINE PREG, ED  POCT PREGNANCY, URINE   ____________________________________________  EKG  ED ECG REPORT I, [facility], the attending physician, personally viewed and interpreted this ECG.  Date: 05/22/2019 EKG Time: 1109 Rate: 107 Rhythm: Sinus tachycardia QRS Axis: normal Intervals: normal ST/T Wave abnormalities: normal Narrative Interpretation: Sinus  tachycardia with no evidence of acute ischemia  ____________________________________________  RADIOLOGY  CXR: No focal infiltrate or other acute abnormality  ____________________________________________   PROCEDURES  Procedure(s) performed: No  Procedures  Critical Care performed: No ____________________________________________   INITIAL IMPRESSION / ASSESSMENT AND PLAN / ED COURSE  Pertinent labs & imaging results that were available during my care of the patient were reviewed by me and considered in my medical decision making (see chart for details).  50 year old female with PMH as noted above including hypertension and hyperlipidemia but no prior cardiac history presents with acute onset of palpitations today while she was at work, associated with some lightheadedness.  She also had some chest discomfort but is not having active chest pain.  She reports increased stress recently and decreased p.o. intake.  On exam, the patient is well-appearing.  Her vital signs are normal except for tachycardia to around 100-110, rising to about 115 when the patient speaks.  The  remainder of the physical exam is unremarkable.  EKG shows sinus tachycardia with no ischemic findings.  Initial labs obtained from triage are unremarkable except that the patient's troponin is minimally elevated.  Differential includes palpitations due to stress/anxiety, ACS, pulmonary embolism, dehydration or hypovolemia, or less likely infectious etiology.  We will obtain a repeat troponin, D-dimer (as the patient's overall PE risk is low) and given a liter of fluids and reassess.  ----------------------------------------- 3:54 PM on 05/22/2019 -----------------------------------------  Repeat troponin has risen slightly.  Given this finding I discussed with the patient admission for further cardiac work-up as she has no history of heart disease or kidney disease that would cause an expected elevated  troponin.  The D-dimer is being run at [facility] and will be delayed, so given the rising troponin I will obtain a CT to evaluate for PE.  I am signing the patient out to the oncoming physician Dr. Charna Archer.  ____________________________________________   FINAL CLINICAL IMPRESSION(S) / ED DIAGNOSES  Final diagnoses:  Palpitations  Elevated troponin      NEW MEDICATIONS STARTED DURING THIS VISIT:  New Prescriptions   No medications on file     Note:  This document was prepared using Dragon voice recognition software and may include unintentional dictation errors.    Arta Silence, MD 05/22/19 1555

## 2019-05-22 NOTE — ED Notes (Signed)
Pt given ginger ale, NAD and no complaints at this time. Pt eating meal that was brought to her by daughter

## 2019-05-22 NOTE — ED Notes (Addendum)
Pt sitting in bed speaking with this RN in NAD, reports increased HR at work today along with mild CP, reports CP has subsided. Pt A&Ox4. Pt reports many recent stressors such as grandchild passing away in November and "taking care of everyone but myself".

## 2019-05-23 ENCOUNTER — Telehealth: Payer: Self-pay | Admitting: Internal Medicine

## 2019-05-23 ENCOUNTER — Other Ambulatory Visit: Payer: Self-pay

## 2019-05-23 ENCOUNTER — Encounter: Payer: Self-pay | Admitting: Internal Medicine

## 2019-05-23 ENCOUNTER — Observation Stay (HOSPITAL_BASED_OUTPATIENT_CLINIC_OR_DEPARTMENT_OTHER)
Admit: 2019-05-23 | Discharge: 2019-05-23 | Disposition: A | Discharge status: Home / Self Care | Payer: BC Managed Care – PPO | Attending: Physician Assistant | Admitting: Physician Assistant

## 2019-05-23 ENCOUNTER — Observation Stay (HOSPITAL_BASED_OUTPATIENT_CLINIC_OR_DEPARTMENT_OTHER): Payer: BC Managed Care – PPO

## 2019-05-23 DIAGNOSIS — I10 Essential (primary) hypertension: Secondary | ICD-10-CM

## 2019-05-23 DIAGNOSIS — R079 Chest pain, unspecified: Secondary | ICD-10-CM

## 2019-05-23 DIAGNOSIS — I309 Acute pericarditis, unspecified: Secondary | ICD-10-CM | POA: Diagnosis not present

## 2019-05-23 DIAGNOSIS — R002 Palpitations: Secondary | ICD-10-CM

## 2019-05-23 DIAGNOSIS — R0789 Other chest pain: Secondary | ICD-10-CM

## 2019-05-23 DIAGNOSIS — F411 Generalized anxiety disorder: Secondary | ICD-10-CM | POA: Insufficient documentation

## 2019-05-23 LAB — NM MYOCAR MULTI W/SPECT W/WALL MOTION / EF
LV dias vol: 73 mL (ref 46–106)
LV sys vol: 23 mL
Peak HR: 112 {beats}/min
Percent HR: 64 %
Rest HR: 68 {beats}/min
TID: 0.89

## 2019-05-23 LAB — HEMOGLOBIN A1C
Hgb A1c MFr Bld: 5.1 % (ref 4.8–5.6)
Mean Plasma Glucose: 99.67 mg/dL

## 2019-05-23 LAB — LIPID PANEL
Cholesterol: 180 mg/dL (ref 0–200)
HDL: 45 mg/dL (ref >40)
LDL Cholesterol: 122 mg/dL — ABNORMAL HIGH (ref 0–99)
Total CHOL/HDL Ratio: 4 RATIO
Triglycerides: 65 mg/dL (ref <150)
VLDL: 13 mg/dL (ref 0–40)

## 2019-05-23 LAB — ECHOCARDIOGRAM COMPLETE
Height: 67 in
Weight: 2192 oz

## 2019-05-23 LAB — SARS CORONAVIRUS 2 (TAT 6-24 HRS): SARS Coronavirus 2: NEGATIVE

## 2019-05-23 LAB — HIV ANTIBODY (ROUTINE TESTING W REFLEX): HIV Screen 4th Generation wRfx: NONREACTIVE

## 2019-05-23 MED ORDER — TECHNETIUM TC 99M TETROFOSMIN IV KIT
10.0000 | PACK | Freq: Once | INTRAVENOUS | Status: AC | PRN
  Administered 2019-05-23: 09:00:00 11.036 via INTRAVENOUS
  Filled 2019-05-23: qty 10

## 2019-05-23 MED ORDER — TECHNETIUM TC 99M TETROFOSMIN IV KIT
30.0000 | PACK | Freq: Once | INTRAVENOUS | Status: AC | PRN
  Administered 2019-05-23: 10:00:00 31.793 via INTRAVENOUS
  Filled 2019-05-23: qty 30

## 2019-05-23 MED ORDER — PANTOPRAZOLE SODIUM 20 MG PO TBEC: 20.0000 mg | DELAYED_RELEASE_TABLET | Freq: Every day | ORAL | 0 refills | Status: AC

## 2019-05-23 MED ORDER — ASPIRIN 81 MG PO CHEW: 81.0000 mg | CHEWABLE_TABLET | Freq: Every day | ORAL | 0 refills | Status: AC

## 2019-05-23 MED ORDER — ESCITALOPRAM OXALATE 5 MG PO TABS: 5.0000 mg | ORAL_TABLET | Freq: Every day | ORAL | 0 refills | Status: DC

## 2019-05-23 MED ORDER — NAPROXEN 500 MG PO TABS: 500.0000 mg | ORAL_TABLET | Freq: Two times a day (BID) | ORAL | 0 refills | Status: AC

## 2019-05-23 MED ORDER — NAPROXEN 500 MG PO TABS: 500.0000 mg | ORAL_TABLET | Freq: Two times a day (BID) | ORAL | 0 refills | Status: DC

## 2019-05-23 MED ORDER — METOPROLOL TARTRATE 25 MG PO TABS: 25.0000 mg | ORAL_TABLET | Freq: Two times a day (BID) | ORAL | 0 refills | Status: DC

## 2019-05-23 MED ORDER — REGADENOSON 0.4 MG/5ML IV SOLN
0.4000 mg | Freq: Once | INTRAVENOUS | Status: AC
  Administered 2019-05-23: 10:00:00 0.4 mg via INTRAVENOUS

## 2019-05-23 MED ORDER — PANTOPRAZOLE SODIUM 20 MG PO TBEC: 20.0000 mg | DELAYED_RELEASE_TABLET | Freq: Every day | ORAL | 0 refills | Status: DC

## 2019-05-23 NOTE — Telephone Encounter (Signed)
TCM....  Patient is being discharged      They are scheduled to see Brion Aliment  1/20 at 155 pm   They were seen for Pericarditis   They need to be seen within  ~ 2 weeks      Please call

## 2019-05-23 NOTE — ED Notes (Signed)
Room light adjusted for pt. Pt laying in bed watching tv. Rails up. Bed locked low. Call bell within reach.

## 2019-05-23 NOTE — Progress Notes (Signed)
Pt discharged home via private car at 1929. Pt was A&Ox4. AVS reviewed with pt and all questions were answered. Pt left with clothes, purse, cell phone, jewelry, and glasses. Pt walked downstairs by RN.

## 2019-05-23 NOTE — Telephone Encounter (Signed)
-----   Message from Yvonne Kendall, MD sent at 05/23/2019  3:29 PM EST ----- Regarding: Hospital follow-up Hello,  Could you schedule Ms. Rathke to f/u with me or an APP in ~2 weeks for f/u of pericarditis?  Thanks.  Thayer Ohm

## 2019-05-23 NOTE — Consult Note (Signed)
[place] Psychiatry Consult   Reason for Consult: Anxiety and depression Referring Physician: Dr. Posey Pronto Patient Identification: Ana Cannon MRN:  [id number] Principal Diagnosis: <principal problem not specified> Diagnosis:  Active Problems:   Chest pain   Total Time spent with patient: 45 minutes  Subjective:   Ana Cannon is a 49 y.o. female patient who presented with heart palpitations, admitted for cardiac work-up and monitoring.  HPI: Patient is a 49 year old female who presents with heart palpitations.  Patient was found to have some degree of irregularities in the emergency department and being further worked up on the telemetry floors. Psychiatry was consulted due to potential anxiety component to patient's presentation.  Upon evaluation patient was calm and cooperative and forthcoming with Probation officer.  She details prior panic attacks including times where she felt lightheaded and as if her thoughts are racing.  She does acknowledge significant stressors in her life including recent loss of her mother approximately 5 years ago from which she has suffered complicated grief, as well as recent loss of her grandchild who was a 6-month old baby.  Patient is tearful when discussing these recent stressors and details her trouble coping with the stress of the loss.  Despite this patient feels that her experience yesterday was something beyond anxiety as she felt chest pain that she had never experienced before.  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  She denies any suicidal or homicidal ideation she denies audio any audio or visual hallucinations.  She denies any drug or alcohol use.  Past Psychiatric History: Patient previously saw outpatient psychiatrist before due to the loss of her  mother approximately 5 years ago.  Patient was placed on his medications at the time which she continues to get filled from her primary care doctor.  She reports not having seen psychiatry services in over a year. Risk to Self:  No Risk to Others:  No Prior Inpatient Therapy:  No Prior Outpatient Therapy:  Yes  Past Medical History:  Past Medical History:  Diagnosis Date  . Hyperlipemia   . Hypertension    No past surgical history on file. Family History:  Family History  Problem Relation Age of Onset  . CAD Mother   . CAD Father    Family Psychiatric  History: Denies  Social History:  Social History   Substance and Sexual Activity  Alcohol Use Yes     Social History   Substance and Sexual Activity  Drug Use No    Social History   Socioeconomic History  . Marital status: Single    Spouse name: Not on file  . Number of children: Not on file  . Years of education: Not on file  . Highest education level: Not on file  Occupational History  . Not on file  Tobacco Use  . Smoking status: Current Every Day Smoker    Packs/day: 0.50    Types: Cigarettes  . Smokeless tobacco: Never Used  Substance and Sexual Activity  . Alcohol use: Yes  . Drug use: No  . Sexual activity: Not on file  Other Topics Concern  . Not on file  Social History Narrative  . Not on file   Social Determinants of Health   Financial Resource Strain:   .  Difficulty of Paying Living Expenses: Not on file  Food Insecurity:   . Worried About Programme researcher, broadcasting/film/video in the Last Year: Not on file  . Ran Out of Food in the Last Year: Not on file  Transportation Needs:   . Lack of Transportation (Medical): Not on file  . Lack of Transportation (Non-Medical): Not on file  Physical Activity:   . Days of Exercise per Week: Not on file  . Minutes of Exercise per Session: Not on file  Stress:   . Feeling of Stress : Not on file  Social Connections:   . Frequency of Communication with Friends and Family:  Not on file  . Frequency of Social Gatherings with Friends and Family: Not on file  . Attends Religious Services: Not on file  . Active Member of Clubs or Organizations: Not on file  . Attends Banker Meetings: Not on file  . Marital Status: Not on file   Additional Social History: Patient works as a Psychiatric nurse.  Lives at home with her 2 kids and her 2  nieces and nephews    Allergies:  No Known Allergies  Labs:  Results for orders placed or performed during the hospital encounter of 05/22/19 (from the past 48 hour(s))  Basic metabolic panel     Status: Abnormal   Collection Time: 05/22/19 11:18 AM  Result Value Ref Range   Sodium 138 135 - 145 mmol/L   Potassium 4.1 3.5 - 5.1 mmol/L   Chloride 105 98 - 111 mmol/L   CO2 26 22 - 32 mmol/L   Glucose, Bld 94 70 - 99 mg/dL   BUN 11 6 - 20 mg/dL   Creatinine, Ser 4.09 0.44 - 1.00 mg/dL   Calcium 8.8 (L) 8.9 - 10.3 mg/dL   GFR calc non Af Amer >60 >60 mL/min   GFR calc Af Amer >60 >60 mL/min   Anion gap 7 5 - 15    Comment: Performed at [facility], [address] [place], [place] [address]  CBC     Status: Abnormal   Collection Time: 05/22/19 11:18 AM  Result Value Ref Range   WBC 8.6 4.0 - 10.5 K/uL   RBC 4.21 3.87 - 5.11 MIL/uL   Hemoglobin 12.8 12.0 - 15.0 g/dL   HCT 99.2 (L) 42.6 - 83.4 %   MCV 84.3 80.0 - 100.0 fL   MCH 30.4 26.0 - 34.0 pg   MCHC 36.1 (H) 30.0 - 36.0 g/dL   RDW 19.6 22.2 - 97.9 %   Platelets 275 150 - 400 K/uL   nRBC 0.0 0.0 - 0.2 %    Comment: Performed at [facility], [address] [place], [place] [address]  Troponin I (High Sensitivity)     Status: Abnormal   Collection Time: 05/22/19 11:18 AM  Result Value Ref Range   Troponin I (High Sensitivity) 22 (H) <18 ng/L    Comment: (NOTE) Elevated high sensitivity troponin I (hsTnI) values and significant  changes across serial measurements may suggest ACS but many other  chronic and acute conditions are known  to elevate hsTnI results.  Refer to the "Links" section for chest pain algorithms and additional  guidance. Performed at [facility], [address] [place], [place] [address]   Pregnancy, urine POC     Status: None   Collection Time: 05/22/19 11:32 AM  Result Value Ref Range   Preg Test, Ur NEGATIVE NEGATIVE    Comment:  THE SENSITIVITY OF THIS METHODOLOGY IS >24 mIU/mL   Troponin I (High Sensitivity)     Status: Abnormal   Collection Time: 05/22/19  1:31 PM  Result Value Ref Range   Troponin I (High Sensitivity) 31 (H) <18 ng/L    Comment: (NOTE) Elevated high sensitivity troponin I (hsTnI) values and significant  changes across serial measurements may suggest ACS but many other  chronic and acute conditions are known to elevate hsTnI results.  Refer to the "Links" section for chest pain algorithms and additional  guidance. Performed at [facility], [address] [place], [place] [address]   D-dimer, quantitative (not at [facility])     Status: Abnormal   Collection Time: 05/22/19  1:31 PM  Result Value Ref Range   D-Dimer, Quant 0.90 (H) 0.00 - 0.50 ug/mL-FEU    Comment: (NOTE) At the manufacturer cut-off of 0.50 ug/mL FEU, this assay has been documented to exclude PE with a sensitivity and negative predictive value of 97 to 99%.  At this time, this assay has not been approved by the FDA to exclude DVT/VTE. Results should be correlated with clinical presentation. Performed at [facility], [address] [place], [place] [address]   SARS CORONAVIRUS 2 (TAT 6-24 HRS) Nasopharyngeal Nasopharyngeal Swab     Status: None   Collection Time: 05/22/19  5:42 PM   Specimen: Nasopharyngeal Swab  Result Value Ref Range   SARS Coronavirus 2 NEGATIVE NEGATIVE    Comment: (NOTE) SARS-CoV-2 target nucleic acids are NOT DETECTED. The SARS-CoV-2 RNA is generally detectable in upper and lower respiratory specimens during the acute phase of infection.  Negative results do not preclude SARS-CoV-2 infection, do not rule out co-infections with other pathogens, and should not be used as the sole basis for treatment or other patient management decisions. Negative results must be combined with clinical observations, patient history, and epidemiological information. The expected result is Negative. Fact Sheet for Patients: HairSlick.no Fact Sheet for Healthcare Providers: quierodirigir.com This test is not yet approved or cleared by the [place] FDA and  has been authorized for detection and/or diagnosis of SARS-CoV-2 by FDA under an Emergency Use Authorization (EUA). This EUA will remain  in effect (meaning this test can be used) for the duration of the COVID-19 declaration under Section 56 4(b)(1) of the Act, 21 U.S.C. section 360bbb-3(b)(1), unless the authorization is terminated or revoked sooner. Performed at [facility], [address] [place], [place] [address]     Current Facility-Administered Medications  Medication Dose Route Frequency Provider Last Rate Last Admin  . aspirin chewable tablet 81 mg  81 mg Oral Daily Enedina Finner, MD   81 mg at 05/22/19 2235  . enoxaparin (LOVENOX) injection 40 mg  40 mg Subcutaneous Q24H Enedina Finner, MD      . famotidine (PEPCID) tablet 20 mg  20 mg Oral BID Enedina Finner, MD   20 mg at 05/22/19 2235  . metoprolol tartrate (LOPRESSOR) tablet 25 mg  25 mg Oral BID Enedina Finner, MD   25 mg at 05/22/19 1751  . mirtazapine (REMERON) tablet 45 mg  45 mg Oral QHS Enedina Finner, MD   45 mg at 05/23/19 0006   Current Outpatient Medications  Medication Sig Dispense Refill  . mirtazapine (REMERON) 15 MG tablet Take 1 tablet (15 mg total) by mouth at bedtime. 30 tablet 2  . aluminum-magnesium hydroxide-simethicone (MAALOX) 200-200-20 MG/5ML SUSP Take 30 mLs by mouth 4 (four) times daily -  before meals and at bedtime. 355 mL 0  . famotidine  (PEPCID) 20 MG tablet Take 1 tablet (20 mg total) by mouth 2 (two) times daily. (Patient not taking: Reported on 05/22/2019) 60 tablet 0  . hydrochlorothiazide (HYDRODIURIL) 25 MG tablet Take 1 tablet (25 mg total) by mouth daily. (Patient not taking: Reported on 01/11/2018) 30 tablet 2  . metoCLOPramide (REGLAN) 10 MG tablet Take 1 tablet (10 mg total) by mouth every 6 (six) hours as needed. (Patient not taking: Reported on 05/22/2019) 30 tablet 0    Musculoskeletal: Strength & Muscle Tone: within normal limits Gait & Station: normal Patient leans: N/A  Psychiatric Specialty Exam: Physical Exam  Review of Systems  Psychiatric/Behavioral: Positive for sleep disturbance. Negative for agitation, behavioral problems, hallucinations, self-injury and suicidal ideas. The patient is nervous/anxious.     Blood pressure (!) 140/99, pulse 72, temperature 99.8 F (37.7 C), temperature source Oral, resp. rate 17, height 5\' 7"  (1.702 m), weight 62.1 kg, SpO2 100 %.Body mass index is 21.46 kg/m.  General Appearance: Casual  Eye Contact:  Good  Speech:  Clear and Coherent  Volume:  Normal  Mood:  Anxious  Affect:  Appropriate  Thought Process:  Coherent  Orientation:  Full (Time, Place, and Person)  Thought Content:  Logical  Suicidal Thoughts:  No  Homicidal Thoughts:  No  Memory:  Recent;   Good  Judgement:  Good  Insight:  Good  Psychomotor Activity:  Normal  Concentration:  Concentration: Fair  Recall:  Fair  Fund of Knowledge:  Fair  Language:  Good  Akathisia:  No  Handed:  Right  AIMS (if indicated):     Assets:  Communication Skills Desire for Improvement Financial Resources/Insurance Housing Physical Health Resilience Social Support Vocational/Educational  ADL's:  Intact  Cognition:  WNL  Sleep:        Treatment Plan Summary: 49 year old female with past psychiatric history of complicated grief now presenting with anxiety in the context of social stressors.  Remains  unclear at this time if anxiety is the sole contributor to the patient's presentation or if there is an underlying cardiac issue.  Regardless patient would benefit from psychiatric medication as well as outpatient psych services.  Patient provided with outpatient psych resources.  Recommendations: Please start Klonopin 0.25 mg twice daily and Lexapro 5 mg every morning when deemed medically appropriate.  Disposition: No evidence of imminent risk to self or others at present.   Patient does not meet criteria for psychiatric inpatient admission. Supportive therapy provided about ongoing stressors. Discussed crisis plan, support from social network, calling 911, coming to the Emergency Department, and calling Suicide Hotline.  51, MD 05/23/2019 11:43 AM

## 2019-05-23 NOTE — Discharge Summary (Signed)
D/c summary was miss labeled as a progress note on 05/23/19.

## 2019-05-23 NOTE — ED Notes (Signed)
Dr at bedside, pt in no distress, call light in reach, bed locked and low.

## 2019-05-23 NOTE — ED Notes (Addendum)
Pt transported to radiology.

## 2019-05-23 NOTE — Progress Notes (Signed)
*  PRELIMINARY RESULTS* Echocardiogram 2D Echocardiogram has been performed.  Cristela Blue 05/23/2019, 3:09 PM

## 2019-05-23 NOTE — Discharge Summary (Signed)
Physician Discharge Summary  Ana Cannon IRJ:[id number] DOB: 14-Oct-1970 DOA: 05/22/2019  PCP: Aldean Jewett, MD  Admit date: 05/22/2019 Discharge date: 05/23/2019  Admitted From: home Disposition:  home  Recommendations for Outpatient Follow-up:  1. Follow up with PCP in 1-2 weeks 2. F/u w/ cardio (Dr. Saunders Revel) in 2 weeks  Home Health:no  Equipment/Devices: n/a  Discharge Condition: stable CODE STATUS: full  Diet recommendation: Heart Healthy  Brief/Interim Summary: HPI taken from Dr. Posey Pronto: Ana Cannon  is a 49 y.o. female with a known history of hypertension currently not on medication comes to the emergency room with complaints of chest pain and palpitation. Patient was at work and started noticing her heart palpitation and fluttering. She came to the emergency room with heart rate of 116.  ED course: patient was found to have tachycardia and elevated blood pressure. She was noted to have troponin of 22 which went up to 31. No cardiac history. Patient is being admitted for chest pain rule out. CT chest was negative for PE.  Cording to the patient she is been going through a lot of stressors at home she is the breadwinner and is still grieving her grandchild's death. She was tearful during my conversation. Denies any chest pain during my evaluation. She is being admitted for further evaluation management  COVID is still pending. Full dose aspirin was given.  Hospital Course from Dr. Lenise Herald 05/23/19: Cardio was consulted for chest pain and elevated troponins. Cardiac stress test was normal. Cardio thought the pt may have pericarditis and recommended started the pt on naproxen 500mg  BID and PPI. Pt will f/u w/ cardio as an outpatient in 2 weeks.   Discharge Diagnoses:  Active Problems:   Chest pain   Acute pericarditis   Chest pain: w/ mild elevated troponin. Cardiac stress test normal. Possible pericarditis and will start Naproxen 500mg  BID & PPI as per cardio.  Will f/u w/ cardio in 2 weeks. Continue on aspirin & metoprolol.  Continue on tele. Cardio following and recs apprec   Anxiety: with significant stressors at home. Taking mirtazapine but pt thinks it's not helping her. Psychiatry recommends outpatient f/u   Hypertension: used to be on hydrochlorothiazide which was discontinued by her primary care since her blood pressure was staying lower side. Will continue on metoprolol   Discharge Instructions  Discharge Instructions    Diet - low sodium heart healthy   Complete by: As directed    Discharge instructions   Complete by: As directed    F/u PCP in 1-2 weeks; F/u cardio (Dr. Saunders Revel) in 2 weeks   Increase activity slowly   Complete by: As directed      Allergies as of 05/23/2019   No Known Allergies     Medication List    STOP taking these medications   hydrochlorothiazide 25 MG tablet Commonly known as: HYDRODIURIL     TAKE these medications   aluminum-magnesium hydroxide-simethicone 301-601-09 MG/5ML Susp Commonly known as: MAALOX Take 30 mLs by mouth 4 (four) times daily -  before meals and at bedtime.   aspirin 81 MG chewable tablet Chew 1 tablet (81 mg total) by mouth daily. Start taking on: May 24, 2019   famotidine 20 MG tablet Commonly known as: PEPCID Take 1 tablet (20 mg total) by mouth 2 (two) times daily.   metoCLOPramide 10 MG tablet Commonly known as: REGLAN Take 1 tablet (10 mg total) by mouth every 6 (six) hours as needed.   metoprolol tartrate 25  MG tablet Commonly known as: LOPRESSOR Take 1 tablet (25 mg total) by mouth 2 (two) times daily.   mirtazapine 15 MG tablet Commonly known as: Remeron Take 1 tablet (15 mg total) by mouth at bedtime.   naproxen 500 MG tablet Commonly known as: Naprosyn Take 1 tablet (500 mg total) by mouth 2 (two) times daily with a meal for 14 days.   pantoprazole 20 MG tablet Commonly known as: Protonix Take 1 tablet (20 mg total) by mouth daily.      Follow-up  Information    End, Cristal Deer, MD On 06/07/2019.   Specialty: Cardiology Why: At 1:45pm Contact information: [address] [place] [address] [phone]          No Known Allergies  Consultations:  cardio   Procedures/Studies: DG Chest 2 View  Result Date: 05/22/2019 CLINICAL DATA:  Chest pain, tachycardia EXAM: CHEST - 2 VIEW COMPARISON:  09/16/2011 FINDINGS: The heart size and mediastinal contours are within normal limits. Both lungs are clear. The visualized skeletal structures are unremarkable. IMPRESSION: No active cardiopulmonary disease. Electronically Signed   By: Judie Petit.  Shick M.D.   On: 05/22/2019 11:42   CT Angio Chest PE W and/or Wo Contrast  Result Date: 05/22/2019 CLINICAL DATA:  Pulmonary embolism. Tachycardia and chest pain. Elevated troponin. EXAM: CT ANGIOGRAPHY CHEST WITH CONTRAST TECHNIQUE: Multidetector CT imaging of the chest was performed using the standard protocol during bolus administration of intravenous contrast. Multiplanar CT image reconstructions and MIPs were obtained to evaluate the vascular anatomy. CONTRAST:  41mL OMNIPAQUE IOHEXOL 350 MG/ML SOLN COMPARISON:  None. FINDINGS: Cardiovascular: Contrast injection is sufficient to demonstrate satisfactory opacification of the pulmonary arteries to the segmental level. There is no pulmonary embolus. The main pulmonary artery is within normal limits for size. There is no CT evidence of acute right heart strain. The visualized aorta is normal. Heart size is borderline enlarged. Mediastinum/Nodes: --No mediastinal or hilar lymphadenopathy. --No axillary lymphadenopathy. --No supraclavicular lymphadenopathy. --Normal thyroid gland. --The esophagus is unremarkable Lungs/Pleura: No pulmonary nodules or masses. No pleural effusion or pneumothorax. No focal airspace consolidation. No focal pleural abnormality. Upper Abdomen: No acute abnormality. Musculoskeletal: No chest wall abnormality. No acute or  significant osseous findings. Review of the MIP images confirms the above findings. IMPRESSION: No evidence of pulmonary embolism or other acute intrathoracic process. Electronically Signed   By: Katherine Mantle M.D.   On: 05/22/2019 16:29   NM Myocar Multi W/Spect W/Wall Motion / EF  Result Date: 05/23/2019  Normal pharmacologic myocardial perfusion stress test without significant ischemia or scar.  The left ventricular ejection fraction is hyperdynamic (>65%).  There is no significant coronary artery calcification.  This is a low risk study.    ECHOCARDIOGRAM COMPLETE  Result Date: 05/23/2019   ECHOCARDIOGRAM REPORT   Patient Name:   Ana Cannon Date of Exam: 05/23/2019 Medical Rec #:  [id number]         Height:       67.0 in Accession #:    [id number]        Weight:       137.0 lb Date of Birth:  [birth year]         BSA:          1.72 m Patient Age:    48 years          BP:           146/99 mmHg Patient Gender: F  HR:           76 bpm. Exam Location:  [facility] Procedure: 2D Echo, Cardiac Doppler and Color Doppler Indications:     Chest pain 786.50  History:         Patient has no prior history of Echocardiogram examinations.                  Risk Factors:Hypertension and Dyslipidemia.  Sonographer:     Cristela BlueJerry Hege RDCS (AE) Referring Phys:  [id number] Raymon MuttonYAN M DUNN Diagnosing Phys: Yvonne Kendallhristopher End MD IMPRESSIONS  1. Left ventricular ejection fraction, by visual estimation, is 65 to 70%. The left ventricle has normal function. There is mildly increased left ventricular hypertrophy.  2. Elevated left atrial pressure.  3. Left ventricular diastolic parameters are consistent with Grade II diastolic dysfunction (pseudonormalization).  4. The left ventricle has no regional wall motion abnormalities.  5. Global right ventricle has normal systolic function.The right ventricular size is normal. No increase in right ventricular wall thickness.  6. Left atrial size was normal.  7. Right atrial size was  normal.  8. Small pericardial effusion.  9. The pericardial effusion is anterior to the right ventricle. 10. The mitral valve is degenerative. Trivial mitral valve regurgitation. 11. The tricuspid valve is grossly normal. 12. The aortic valve is grossly normal. Aortic valve regurgitation is not visualized. No evidence of aortic valve sclerosis or stenosis. 13. The pulmonic valve was not well visualized. Pulmonic valve regurgitation is trivial. 14. Aortic dilatation noted. 15. There is borderline dilatation of the aortic root measuring 36 mm. 16. Normal pulmonary artery systolic pressure. 17. The inferior vena cava is normal in size with greater than 50% respiratory variability, suggesting right atrial pressure of 3 mmHg. 18. The interatrial septum was not well visualized. FINDINGS  Left Ventricle: Left ventricular ejection fraction, by visual estimation, is 65 to 70%. The left ventricle has normal function. The left ventricle has no regional wall motion abnormalities. The left ventricular internal cavity size was the left ventricle is normal in size. There is mildly increased left ventricular hypertrophy. Left ventricular diastolic parameters are consistent with Grade II diastolic dysfunction (pseudonormalization). Elevated left atrial pressure. Right Ventricle: The right ventricular size is normal. No increase in right ventricular wall thickness. Global RV systolic function is has normal systolic function. The tricuspid regurgitant velocity is 1.98 m/s, and with an assumed right atrial pressure  of 3 mmHg, the estimated right ventricular systolic pressure is normal at 18.7 mmHg. Left Atrium: Left atrial size was normal in size. Right Atrium: Right atrial size was normal in size Pericardium: A small pericardial effusion is present. The pericardial effusion is anterior to the right ventricle. There is no evidence of cardiac tamponade. Mitral Valve: The mitral valve is degenerative in appearance. There is mild  thickening of the mitral valve leaflet(s). Trivial mitral valve regurgitation. Tricuspid Valve: The tricuspid valve is grossly normal. Tricuspid valve regurgitation is trivial. Aortic Valve: The aortic valve is grossly normal. Aortic valve regurgitation is not visualized. The aortic valve is structurally normal, with no evidence of sclerosis or stenosis. Aortic valve mean gradient measures 2.0 mmHg. Aortic valve peak gradient measures 3.8 mmHg. Aortic valve area, by VTI measures 1.86 cm. Pulmonic Valve: The pulmonic valve was not well visualized. Pulmonic valve regurgitation is trivial. Pulmonic regurgitation is trivial. No evidence of pulmonic stenosis. Aorta: Aortic dilatation noted. There is borderline dilatation of the aortic root measuring 36 mm. Pulmonary Artery: The pulmonary artery is not well seen.  Venous: The inferior vena cava is normal in size with greater than 50% respiratory variability, suggesting right atrial pressure of 3 mmHg. IAS/Shunts: The interatrial septum was not well visualized.  LEFT VENTRICLE PLAX 2D LVIDd:         3.60 cm  Diastology LVIDs:         2.11 cm  LV e' lateral:   10.00 cm/s LV PW:         0.99 cm  LV E/e' lateral: 6.4 LV IVS:        1.18 cm  LV e' medial:    5.44 cm/s LVOT diam:     2.00 cm  LV E/e' medial:  11.7 LV SV:         40 ml LV SV Index:   23.30 LVOT Area:     3.14 cm  RIGHT VENTRICLE RV Basal diam:  2.07 cm RV S prime:     17.20 cm/s TAPSE (M-mode): 2.7 cm LEFT ATRIUM             Index       RIGHT ATRIUM           Index LA diam:        2.80 cm 1.63 cm/m  RA Area:     10.60 cm LA Vol (A2C):   32.3 ml 18.76 ml/m RA Volume:   22.50 ml  13.07 ml/m LA Vol (A4C):   13.4 ml 7.78 ml/m LA Biplane Vol: 22.8 ml 13.24 ml/m  AORTIC VALVE                   PULMONIC VALVE AV Area (Vmax):    2.26 cm    PV Vmax:        0.66 m/s AV Area (Vmean):   1.95 cm    PV Peak grad:   1.8 mmHg AV Area (VTI):     1.86 cm    RVOT Peak grad: 2 mmHg AV Vmax:           97.55 cm/s AV Vmean:           69.750 cm/s AV VTI:            0.187 m AV Peak Grad:      3.8 mmHg AV Mean Grad:      2.0 mmHg LVOT Vmax:         70.20 cm/s LVOT Vmean:        43.300 cm/s LVOT VTI:          0.111 m LVOT/AV VTI ratio: 0.59  AORTA Ao Root diam: 2.10 cm MITRAL VALVE                        TRICUSPID VALVE MV Area (PHT): 3.12 cm             TR Peak grad:   15.7 mmHg MV PHT:        70.47 msec           TR Vmax:        198.00 cm/s MV Decel Time: 243 msec MV E velocity: 63.50 cm/s 103 cm/s  SHUNTS MV A velocity: 50.90 cm/s 70.3 cm/s Systemic VTI:  0.11 m MV E/A ratio:  1.25       1.5       Systemic Diam: 2.00 cm  Yvonne Kendallhristopher End MD Electronically signed by Yvonne Kendallhristopher End MD Signature Date/Time: 05/23/2019/3:25:48 PM    Final  Subjective: Pt c/o being hungry  Discharge Exam: Vitals:   05/23/19 1458 05/23/19 1536  BP: (!) 146/99 (!) 145/97  Pulse: 76 73  Resp: 16 17  Temp:  98.2 F (36.8 C)  SpO2: 100% 100%   Vitals:   05/23/19 1236 05/23/19 1458 05/23/19 1536 05/23/19 1542  BP: (!) 131/95 (!) 146/99 (!) 145/97   Pulse: 67 76 73   Resp: 16 16 17    Temp:   98.2 F (36.8 C)   TempSrc:   Oral   SpO2: 99% 100% 100%   Weight:    56 kg  Height:    5\' 7"  (1.702 m)    General: Pt is alert, awake, not in acute distress Cardiovascular: S1/S2 +, no rubs, no gallops Respiratory: CTA bilaterally, no wheezing, no rhonchi Abdominal: Soft, NT, ND, bowel sounds + Extremities: no edema, no cyanosis    The results of significant diagnostics from this hospitalization (including imaging, microbiology, ancillary and laboratory) are listed below for reference.     Microbiology: Recent Results (from the past 240 hour(s))  SARS CORONAVIRUS 2 (TAT 6-24 HRS) Nasopharyngeal Nasopharyngeal Swab     Status: None   Collection Time: 05/22/19  5:42 PM   Specimen: Nasopharyngeal Swab  Result Value Ref Range Status   SARS Coronavirus 2 NEGATIVE NEGATIVE Final    Comment: (NOTE) SARS-CoV-2 target nucleic acids  are NOT DETECTED. The SARS-CoV-2 RNA is generally detectable in upper and lower respiratory specimens during the acute phase of infection. Negative results do not preclude SARS-CoV-2 infection, do not rule out co-infections with other pathogens, and should not be used as the sole basis for treatment or other patient management decisions. Negative results must be combined with clinical observations, patient history, and epidemiological information. The expected result is Negative. Fact Sheet for Patients: Fact Sheet for Healthcare Providers: 07/20/19 This test is not yet approved or cleared by the [employer] FDA and  has been authorized for detection and/or diagnosis of SARS-CoV-2 by FDA under an Emergency Use Authorization (EUA). This EUA will remain  in effect (meaning this test can be used) for the duration of the COVID-19 declaration under Section 56 4(b)(1) of the Act, 21 U.S.C. section 360bbb-3(b)(1), unless the authorization is terminated or revoked sooner. Performed at [facility], [address], [place], [address] [place]      Labs: BNP (last 3 results) No results for input(s): BNP in the last 8760 hours. Basic Metabolic Panel: Recent Labs  Lab 05/22/19 1118  NA 138  K 4.1  CL 105  CO2 26  GLUCOSE 94  BUN 11  CREATININE 0.71  CALCIUM 8.8*   Liver Function Tests: No results for input(s): AST, ALT, ALKPHOS, BILITOT, PROT, ALBUMIN in the last 168 hours. No results for input(s): LIPASE, AMYLASE in the last 168 hours. No results for input(s): AMMONIA in the last 168 hours. CBC: Recent Labs  Lab 05/22/19 1118  WBC 8.6  HGB 12.8  HCT 35.5*  MCV 84.3  PLT 275   Cardiac Enzymes: No results for input(s): CKTOTAL, CKMB, CKMBINDEX, TROPONINI in the last 168 hours. BNP: Invalid input(s): POCBNP CBG: No results for input(s): GLUCAP in the last 168 hours. D-Dimer Recent Labs     05/22/19 1331  DDIMER 0.90*   Hgb A1c No results for input(s): HGBA1C in the last 72 hours. Lipid Profile Recent Labs    05/23/19 1149  CHOL 180  HDL 45  LDLCALC 122*  TRIG 65  CHOLHDL 4.0  Thyroid function studies No results for input(s): TSH, T4TOTAL, T3FREE, THYROIDAB in the last 72 hours.  Invalid input(s): FREET3 Anemia work up No results for input(s): VITAMINB12, FOLATE, FERRITIN, TIBC, IRON, RETICCTPCT in the last 72 hours. Urinalysis    Component Value Date/Time   COLORURINE YELLOW (A) 12/13/2016 2111   APPEARANCEUR HAZY (A) 12/13/2016 2111   APPEARANCEUR Clear 11/10/2013 1628   LABSPEC 1.012 12/13/2016 2111   LABSPEC 1.014 11/10/2013 1628   PHURINE 5.0 12/13/2016 2111   GLUCOSEU NEGATIVE 12/13/2016 2111   GLUCOSEU Negative 11/10/2013 1628   HGBUR SMALL (A) 12/13/2016 2111   BILIRUBINUR NEGATIVE 12/13/2016 2111   BILIRUBINUR Negative 11/10/2013 1628   KETONESUR NEGATIVE 12/13/2016 2111   PROTEINUR NEGATIVE 12/13/2016 2111   NITRITE POSITIVE (A) 12/13/2016 2111   LEUKOCYTESUR TRACE (A) 12/13/2016 2111   LEUKOCYTESUR Negative 11/10/2013 1628   Sepsis Labs Invalid input(s): PROCALCITONIN,  WBC,  LACTICIDVEN Microbiology Recent Results (from the past 240 hour(s))  SARS CORONAVIRUS 2 (TAT 6-24 HRS) Nasopharyngeal Nasopharyngeal Swab     Status: None   Collection Time: 05/22/19  5:42 PM   Specimen: Nasopharyngeal Swab  Result Value Ref Range Status   SARS Coronavirus 2 NEGATIVE NEGATIVE Final    Comment: (NOTE) SARS-CoV-2 target nucleic acids are NOT DETECTED. The SARS-CoV-2 RNA is generally detectable in upper and lower respiratory specimens during the acute phase of infection. Negative results do not preclude SARS-CoV-2 infection, do not rule out co-infections with other pathogens, and should not be used as the sole basis for treatment or other patient management decisions. Negative results must be combined with clinical observations, patient history,  and epidemiological information. The expected result is Negative. Fact Sheet for Patients: [employer] Fact Sheet for Healthcare Providers: quierodirigir.com This test is not yet approved or cleared by the [place] FDA and  has been authorized for detection and/or diagnosis of SARS-CoV-2 by FDA under an Emergency Use Authorization (EUA). This EUA will remain  in effect (meaning this test can be used) for the duration of the COVID-19 declaration under Section 56 4(b)(1) of the Act, 21 U.S.C. section 360bbb-3(b)(1), unless the authorization is terminated or revoked sooner. Performed at [facility], [address], [place], [place] [address]      Time coordinating discharge: Over 30 minutes  SIGNED:   Charise Killian, MD  [facility] 05/23/2019, 4:49 PM Pager   If 7PM-7AM, please contact night-coverage [email or web address] Password [email or web address]

## 2019-05-23 NOTE — Consult Note (Signed)
Cardiology Consultation:   Patient ID: SHAJUANA MCLUCAS; [id number]; January 15, 1971   Admit date: 05/22/2019 Date of Consult: 05/23/2019  Primary Care Provider: Gale Journey, MD Primary Cardiologist: New to [facility] - consult by End Primary Electrophysiologist:  none   Patient Profile:   Ana Cannon is a 49 y.o. female with a hx of HTN, HLD, and ongoing tobacco use who is being seen today for the evaluation of chest pain/elevated troponin and palpitations at the request of Dr. Allena Katz.  History of Present Illness:   Ana Cannon has no previously known cardiac history.  Patient recently lost her 6-month-old grandson in 03/2019 and in the setting has been under significant emotional distress.  She was at work on 05/22/2019 when she developed sudden onset of tachypalpitations lasting approximately 1 to 2 hours with no associated shortness of breath dizziness, presyncope, syncope.  Symptoms spontaneously resolved.  Following this, she developed intermittent sharp needlelike substernal to right-sided chest discomfort that would last for 1 to 2 seconds with spontaneous resolution and would occur approximately every 30 minutes.  She denies any lower extremity swelling, abdominal tension, or orthopnea.  She is a current everyday smoker of approximately 1/2 pack daily and has done so since age 19 outside of 1 year in which she quit.  She denies any illegal drugs.  Due to symptoms, she presented to [facility].  Upon the patient's arrival to [facility] they were found to have stable vital signs with heart rates in the 70s to low 100s bpm. EKG showed sinus tachycardia, 107 bpm, unable to exclude prior anterior infarct, nonspecific ST-T changes, CXR showed no active cardiopulmonary disease. CTA chest was negative for PE with no significant evidence of coronary artery calcification. Labs showed HS-Tn 22 with a delta of 31, D-dimer 0.90, COVID-19 negative, HCG negative, WBC 8.6, HGB 12.8, PLT 275, K+ 4.1, BUN/SCr  11/0.71. in the ED, she was given ASA 324 mg x 1. Currently, she is chest pain and palpitation free.   Past Medical History:  Diagnosis Date  . Hyperlipemia   . Hypertension     No past surgical history on file.   Home Meds: Prior to Admission medications   Medication Sig Start Date End Date Taking? Authorizing Provider  mirtazapine (REMERON) 15 MG tablet Take 1 tablet (15 mg total) by mouth at bedtime. 06/20/15 05/22/19 Yes Emily Filbert, MD  aluminum-magnesium hydroxide-simethicone (MAALOX) 200-200-20 MG/5ML SUSP Take 30 mLs by mouth 4 (four) times daily -  before meals and at bedtime. 01/11/18   Sharman Cheek, MD  famotidine (PEPCID) 20 MG tablet Take 1 tablet (20 mg total) by mouth 2 (two) times daily. Patient not taking: Reported on 05/22/2019 01/11/18   Sharman Cheek, MD  hydrochlorothiazide (HYDRODIURIL) 25 MG tablet Take 1 tablet (25 mg total) by mouth daily. Patient not taking: Reported on 01/11/2018 06/20/15   Emily Filbert, MD  metoCLOPramide (REGLAN) 10 MG tablet Take 1 tablet (10 mg total) by mouth every 6 (six) hours as needed. Patient not taking: Reported on 05/22/2019 01/11/18   Sharman Cheek, MD    Inpatient Medications: Scheduled Meds: . aspirin  81 mg Oral Daily  . enoxaparin (LOVENOX) injection  40 mg Subcutaneous Q24H  . famotidine  20 mg Oral BID  . metoprolol tartrate  25 mg Oral BID  . mirtazapine  45 mg Oral QHS   Continuous Infusions:  PRN Meds:   Allergies:  No Known Allergies  Social History:   Social History  Socioeconomic History  . Marital status: Single    Spouse name: Not on file  . Number of children: Not on file  . Years of education: Not on file  . Highest education level: Not on file  Occupational History  . Not on file  Tobacco Use  . Smoking status: Current Every Day Smoker    Packs/day: 0.50    Types: Cigarettes  . Smokeless tobacco: Never Used  Substance and Sexual Activity  . Alcohol use: Yes  . Drug use:  No  . Sexual activity: Not on file  Other Topics Concern  . Not on file  Social History Narrative  . Not on file   Social Determinants of Health   Financial Resource Strain:   . Difficulty of Paying Living Expenses: Not on file  Food Insecurity:   . Worried About Programme researcher, broadcasting/film/video in the Last Year: Not on file  . Ran Out of Food in the Last Year: Not on file  Transportation Needs:   . Lack of Transportation (Medical): Not on file  . Lack of Transportation (Non-Medical): Not on file  Physical Activity:   . Days of Exercise per Week: Not on file  . Minutes of Exercise per Session: Not on file  Stress:   . Feeling of Stress : Not on file  Social Connections:   . Frequency of Communication with Friends and Family: Not on file  . Frequency of Social Gatherings with Friends and Family: Not on file  . Attends Religious Services: Not on file  . Active Member of Clubs or Organizations: Not on file  . Attends Banker Meetings: Not on file  . Marital Status: Not on file  Intimate Partner Violence:   . Fear of Current or Ex-Partner: Not on file  . Emotionally Abused: Not on file  . Physically Abused: Not on file  . Sexually Abused: Not on file     Family History:   Family History  Problem Relation Age of Onset  . CAD Mother   . CAD Father     ROS:  Review of Systems  Constitutional: Positive for malaise/fatigue. Negative for chills, diaphoresis, fever and weight loss.  HENT: Negative for congestion.   Eyes: Negative for discharge and redness.  Respiratory: Negative for cough, hemoptysis, sputum production, shortness of breath and wheezing.   Cardiovascular: Positive for chest pain and palpitations. Negative for orthopnea, claudication, leg swelling and PND.  Gastrointestinal: Negative for abdominal pain, blood in stool, heartburn, melena, nausea and vomiting.  Genitourinary: Negative for hematuria.  Musculoskeletal: Negative for falls and myalgias.  Skin:  Negative for rash.  Neurological: Positive for weakness. Negative for dizziness, tingling, tremors, sensory change, speech change, focal weakness and loss of consciousness.  Endo/Heme/Allergies: Does not bruise/bleed easily.  Psychiatric/Behavioral: Negative for substance abuse. The patient is nervous/anxious.   All other systems reviewed and are negative.     Physical Exam/Data:   Vitals:   05/23/19 0300 05/23/19 0400 05/23/19 0500 05/23/19 0600  BP:      Pulse: 76 80 66 72  Resp: (!) 22 18 16 17   Temp:      TempSrc:      SpO2: 100% 98% 100% 100%  Weight:      Height:       No intake or output data in the 24 hours ending 05/23/19 1032 Filed Weights   05/22/19 1110  Weight: 62.1 kg   Body mass index is 21.46 kg/m.   Physical Exam: General:  Well developed, well nourished, in no acute distress. Head: Normocephalic, atraumatic, sclera non-icteric, no xanthomas, nares without discharge.  Neck: Negative for carotid bruits. JVD not elevated. Lungs: Clear bilaterally to auscultation without wheezes, rales, or rhonchi. Breathing is unlabored. Heart: RRR with S1 S2. No murmurs, rubs, or gallops appreciated. Abdomen: Soft, non-tender, non-distended with normoactive bowel sounds. No hepatomegaly. No rebound/guarding. No obvious abdominal masses. Msk:  Strength and tone appear normal for age. Extremities: No clubbing or cyanosis. No edema. Distal pedal pulses are 2+ and equal bilaterally. Neuro: Alert and oriented X 3. No facial asymmetry. No focal deficit. Moves all extremities spontaneously. Psych:  Responds to questions appropriately with a normal affect.   EKG:  The EKG was personally reviewed and demonstrates: sinus tachycardia, 107 bpm, unable to exclude prior anterior infarct, nonspecific ST-T changes Telemetry:  Telemetry was personally reviewed and demonstrates: SR  Weights: [employer]   05/22/19 1110  Weight: 62.1 kg    Relevant CV Studies: 2D echo  pending __________  Steffanie  pending  Laboratory Data:  Chemistry Recent Labs  Lab 05/22/19 1118  NA 138  K 4.1  CL 105  CO2 26  GLUCOSE 94  BUN 11  CREATININE 0.71  CALCIUM 8.8*  GFRNONAA >60  GFRAA >60  ANIONGAP 7    No results for input(s): PROT, ALBUMIN, AST, ALT, ALKPHOS, BILITOT in the last 168 hours. Hematology Recent Labs  Lab 05/22/19 1118  WBC 8.6  RBC 4.21  HGB 12.8  HCT 35.5*  MCV 84.3  MCH 30.4  MCHC 36.1*  RDW 13.8  PLT 275   Cardiac EnzymesNo results for input(s): TROPONINI in the last 168 hours. No results for input(s): TROPIPOC in the last 168 hours.  BNPNo results for input(s): BNP, PROBNP in the last 168 hours.  DDimer  Recent Labs  Lab 05/22/19 1331  DDIMER 0.90*    Radiology/Studies:  DG Chest 2 View  Result Date: 05/22/2019 IMPRESSION: No active cardiopulmonary disease. Electronically Signed   By: Judie Petit.  Shick M.D.   On: 05/22/2019 11:42   CT Angio Chest PE W and/or Wo Contrast  Result Date: 05/22/2019 IMPRESSION: No evidence of pulmonary embolism or other acute intrathoracic process. Electronically Signed   By: Katherine Mantle M.D.   On: 05/22/2019 16:29    Assessment and Plan:   1.  Elevated troponin/chest pain with moderate risk for cardiac etiology: -Currently chest pain-free -Symptoms are overall atypical as she describes a needlelike sharp discomfort that is substernal to right-sided lasting 1 to 2 seconds with spontaneous resolution.  That said, she does have some significant risk factors for coronary artery disease including ongoing tobacco use, hypertension, and family history of CAD -Minimal troponin elevation is not consistent with ACS -No recommendation for initiation of-drip at this time -Agree with [facility] with further recommendations pending -ASA -Check lipid panel and A1c for further risk stratification -CTA chest negative for PE with no significant evidence of coronary artery calcification noted  on images -Obtain echo  2.  Palpitations: -No evidence of significant arrhythmia on EKG or telemetry -Plan for outpatient cardiac monitoring in follow-up -Check TSH -Potassium at goal -Check magnesium -Agree with addition of Lopressor at time of admission  3.  HTN: -Blood pressure reasonably controlled -Continue Lopressor as above  4.  HLD: -Check lipid panel  5.  Tobacco use: -Complete cessation is advised  6.  Emotional distress/anxiety: -Possibly playing a role in the above presentation -Recommend outpatient follow-up with PCP/therapy/psychiatry  For questions or  updates, please contact [place] Please consult [email or web address] for contact info under Cardiology/STEMI.   Signed, Christell Faith, PA-C [place] Pager: [phone] 05/23/2019, 10:32 AM

## 2019-05-24 NOTE — Telephone Encounter (Signed)
Patient contacted regarding discharge from [facility] on 05/23/2019.  Patient understands to follow up with provider Brion Aliment on 06/07/19 at 1:55 pm at [facility]. Patient understands discharge instructions? yes Patient understands medications and regiment? yes Patient understands to bring all medications to this visit? yes

## 2019-06-07 ENCOUNTER — Other Ambulatory Visit: Payer: Self-pay

## 2019-06-07 ENCOUNTER — Ambulatory Visit (INDEPENDENT_AMBULATORY_CARE_PROVIDER_SITE_OTHER): Payer: BC Managed Care – PPO | Admitting: Nurse Practitioner

## 2019-06-07 ENCOUNTER — Encounter: Payer: Self-pay | Admitting: Nurse Practitioner

## 2019-06-07 VITALS — BP 142/84 | HR 71 | Ht 67.0 in | Wt 132.5 lb

## 2019-06-07 DIAGNOSIS — R0789 Other chest pain: Secondary | ICD-10-CM | POA: Diagnosis not present

## 2019-06-07 DIAGNOSIS — I10 Essential (primary) hypertension: Secondary | ICD-10-CM | POA: Diagnosis not present

## 2019-06-07 DIAGNOSIS — Z720 Tobacco use: Secondary | ICD-10-CM

## 2019-06-07 MED ORDER — METOPROLOL TARTRATE 50 MG PO TABS: 50.0000 mg | ORAL_TABLET | Freq: Two times a day (BID) | ORAL | 3 refills | Status: AC

## 2019-06-07 NOTE — Patient Instructions (Signed)
Medication Instructions:  - Your physician has recommended you make the following change in your medication:   1) Increase metoprolol tartrate to 50 mg- take 1 tablet by mouth twice a day  *If you need a refill on your cardiac medications before your next appointment, please call your pharmacy*  Lab Work: - none ordered  If you have labs (blood work) drawn today and your tests are completely normal, you will receive your results only by: Marland Kitchen MyChart Message (if you have MyChart) OR . A paper copy in the mail If you have any lab test that is abnormal or we need to change your treatment, we will call you to review the results.  Testing/Procedures: - none ordered  Follow-Up: At [facility] you and your health needs are our priority.  As part of our continuing mission to provide you with exceptional heart care, we have created designated Provider Care Teams.  These Care Teams include your primary Cardiologist (physician) and Advanced Practice Providers (APPs -  Physician Assistants and Nurse Practitioners) who all work together to provide you with the care you need, when you need it.  Your next appointment:   6 month(s)  The format for your next appointment:   In Person  Provider:   Yvonne Kendall, MD  Other Instructions n/a

## 2019-06-07 NOTE — Progress Notes (Signed)
Office Visit    Patient Name: Ana Cannon Date of Encounter: 06/07/2019  Primary Care Provider:  Aldean Jewett, MD Primary Cardiologist:  Nelva Bush, MD  Chief Complaint    49 y/o ? w/ a h/o HTN, HL, and tobacco abuse, who presents for follow-up after recent hospitalization for atypical chest pain.  Past Medical History    Past Medical History:  Diagnosis Date  . Atypical chest pain    a. 05/2019 MV: EF>65%. No ischemia/scar-->treated w/ NSAIDS - ? pericarditis.  . Diastolic dysfunction    a. 05/2019 Echo: EF 65-70%, mild LVH. Gr2 DD. No rwma. Nl RV size/fxn. Sm pericardial effusion. Triv MR.  Marland Kitchen Hyperlipemia   . Hypertension   . Tobacco abuse    No past surgical history on file.  Allergies  No Known Allergies  History of Present Illness    49 y/o ? with the above PMH including HTN, HL, and tobacco abuse.  She was recently admitted to [facility] on 05/22/2019 after developing intermittent, sharp, right-sided chest pain, lasting a few seconds, and resolving spontaneously.  She also noted intermittent palpitations.  She presented to the [facility] ED and was found to be in sinus tachycardia.  HsTrop was minimally elevated @ 22  31.  CTA chest was neg for PE.  No significant coronary Ca2+ or atherosclerosis was noted either.  She was admitted and seen by our team.  Echo showed nl EF w/ grade II diast dysfxn, and a small pericardial effusion.  Stress testing was undertaken and was non-ischemic.  She was subsequently discharged on a short course of naproxen and antacid therapy.  Since her discharge she has had intermittent chest pain.  This can occur multiple x/wk, up to multiple x/day, and is described as 5/10 right sided, sharp pain, which lasts between a few seconds (typically) and up to 5 mins (rarely).  The pain occurs w/o rhyme or reason and is not any more likely to occur w/ exertion.  She can't be sure if taking naproxen and PPI have helped.  There is no change w/ palpation,  deep breathing, or position changes.  She does feel that the recent death of her grandchild has resulted in a deep depression that is contributing to her symptoms.  She has seen her PCP and referred back to psychiatry.  She denies palpitations, dyspnea, pnd, orthopnea, n, v, dizziness, syncope, edema, weight gain, or early satiety.  She cont to smoke ~ 0.5 ppd of cigarettes.  Home Medications    Prior to Admission medications   Medication Sig Start Date End Date Taking? Authorizing Provider  aluminum-magnesium hydroxide-simethicone (MAALOX) 621-308-65 MG/5ML SUSP Take 30 mLs by mouth 4 (four) times daily -  before meals and at bedtime. 01/11/18   Carrie Mew, MD  aspirin 81 MG chewable tablet Chew 1 tablet (81 mg total) by mouth daily. 05/24/19 06/23/19  Wyvonnia Dusky, MD  escitalopram (LEXAPRO) 5 MG tablet Take 1 tablet (5 mg total) by mouth daily. 05/23/19 06/22/19  Wyvonnia Dusky, MD  famotidine (PEPCID) 20 MG tablet Take 1 tablet (20 mg total) by mouth 2 (two) times daily. Patient not taking: Reported on 05/22/2019 01/11/18   Carrie Mew, MD  metoCLOPramide (REGLAN) 10 MG tablet Take 1 tablet (10 mg total) by mouth every 6 (six) hours as needed. Patient not taking: Reported on 05/22/2019 01/11/18   Carrie Mew, MD  metoprolol tartrate (LOPRESSOR) 25 MG tablet Take 1 tablet (25 mg total) by mouth 2 (two)  times daily. 05/23/19 06/22/19  Charise Killian, MD  mirtazapine (REMERON) 15 MG tablet Take 1 tablet (15 mg total) by mouth at bedtime. 06/20/15 05/22/19  Emily Filbert, MD  pantoprazole (PROTONIX) 20 MG tablet Take 1 tablet (20 mg total) by mouth daily. 05/23/19 06/22/19  Charise Killian, MD    Review of Systems    Intermittent, fleeting, right sided c/p as outlined above.  Significant stress and anxiety related to death of her grandchild.  She denies palpitations, dyspnea, pnd, orthopnea, n, v, dizziness, syncope, edema, weight gain, or early satiety.  All other systems  reviewed and are otherwise negative except as noted above.  Physical Exam    VS:  BP (!) 142/84 (BP Location: Left Arm, Patient Position: Sitting, Cuff Size: Normal)   Pulse 71   Ht 5\' 7"  (1.702 m)   Wt 132 lb 8 oz (60.1 kg)   BMI 20.75 kg/m  , BMI Body mass index is 20.75 kg/m. GEN: Well nourished, well developed, in no acute distress. HEENT: normal. Neck: Supple, no JVD, carotid bruits, or masses. Cardiac: RRR, no murmurs, rubs, or gallops. No clubbing, cyanosis, edema.  Radials/DP/PT 2+ and equal bilaterally.  No chest wall tenderness. Respiratory:  Respirations regular and unlabored, clear to auscultation bilaterally. GI: Soft, nontender, nondistended, BS + x 4. MS: no deformity or atrophy. Skin: warm and dry, no rash. Neuro:  Strength and sensation are intact. Psych: Normal affect.  Accessory Clinical Findings    ECG personally reviewed by me today - RSR, 71, no acute changes.  Lab Results  Component Value Date   WBC 8.6 05/22/2019   HGB 12.8 05/22/2019   HCT 35.5 (L) 05/22/2019   MCV 84.3 05/22/2019   PLT 275 05/22/2019   Lab Results  Component Value Date   CREATININE 0.71 05/22/2019   BUN 11 05/22/2019   NA 138 05/22/2019   K 4.1 05/22/2019   CL 105 05/22/2019   CO2 26 05/22/2019   Lab Results  Component Value Date   ALT 11 01/11/2018   AST 15 01/11/2018   ALKPHOS 53 01/11/2018   BILITOT 0.7 01/11/2018   Lab Results  Component Value Date   CHOL 180 05/23/2019   HDL 45 05/23/2019   LDLCALC 122 (H) 05/23/2019   TRIG 65 05/23/2019   CHOLHDL 4.0 05/23/2019    Lab Results  Component Value Date   HGBA1C 5.1 05/23/2019    Assessment & Plan    1.  Atypical chest pain: Patient evaluated the hospital earlier this month with sharp, needlelike right-sided chest pain lasting a few seconds, and resolving spontaneously.  She had minimal high-sensitivity troponin elevation to a peak of 31.  Stress testing showed no evidence of ischemia or infarct, while  echocardiogram showed normal LV function, grade 2 diastolic dysfunction, and a small pericardial effusion.  She was placed on naproxen and PPI therapy at discharge for management of possible pericarditis though it is notable that ECG was normal and symptoms were not typical for pericarditis.  Since her discharge, she has continued to have intermittent, sharp and fleeting right-sided chest pain that occurs randomly at rest or with activity and is not worsened by activity.  There are no associated symptoms.  She has had a few episodes lasting up to about 5 minutes.  Symptoms have not been reproducible with palpation, position changes, or deep breathing.  She does not typically experience symptoms after meals or when lying down and denies any history of metallic taste in  her throat.  She feels that emotional anxiety and depression related to the relatively recent death of her grandchild continue to play a role.  Given atypical nature of symptoms with prior nonischemic evaluation, I do not feel that further coronary evaluation such as coronary CTA or catheterization are warranted.  I have recommended she continue nonsteroidal and PPI therapy and fortunately, she has been referred to psychiatry for her emotional trauma.  I am going to increase her metoprolol therapy in the setting of ongoing hypertension.  2.  Essential hypertension: Blood pressure elevated today at 142/84.  She does not have a cuff at home and I have encouraged her to obtain 1.  Titrating metoprolol today.  3.  Depression/anxiety: Recently referred back to psychiatry.  She required similar referral after her mother died several years ago and felt that it was beneficial.  4.  Tobacco abuse: Continues to smoke half a pack a day.  Complete cessation advised.  6.  Disposition: Follow-up in cardiology clinic in 3 to 6 months or sooner if necessary.  Nicolasa Ducking, NP 06/07/2019, 3:12 PM

## 2019-12-06 ENCOUNTER — Ambulatory Visit: Payer: BC Managed Care – PPO | Admitting: Internal Medicine

## 2019-12-06 NOTE — Progress Notes (Deleted)
   Follow-up Outpatient Visit Date: 12/06/2019  Primary Care Provider: Gale Journey, MD [address] [place] [address]  Chief Complaint: ***  HPI:  Ms. Gieger is a 49 y.o. female with history of hypertension, hyperlipidemia, and tobacco use, who presents for follow-up of atypical chest pain.  She was last seen in our office in January for follow-up of hospitalization for chest pain.  Myoview was low risk without ischemia or scar.  Ms. Constantin was empirically treated for pericarditis with NSAIDs.  She continued to have intermittent pain at that time and was encouraged to continue NSAIDs and PPI therapy, as well as follow-up with psychiatry for evaluation of emotional trauma.  Metoprolol was also increased in the setting of uncontrolled hypertension.  --------------------------------------------------------------------------------------------------  Past Medical History:  Diagnosis Date  . Atypical chest pain    a. 05/2019 MV: EF>65%. No ischemia/scar-->treated w/ NSAIDS - ? pericarditis.  . Diastolic dysfunction    a. 05/2019 Echo: EF 65-70%, mild LVH. Gr2 DD. No rwma. Nl RV size/fxn. Sm pericardial effusion. Triv MR.  Marland Kitchen Hyperlipemia   . Hypertension   . Tobacco abuse    No past surgical history on file.  No outpatient medications have been marked as taking for the 12/06/19 encounter (Appointment) with Chastity Noland, Cristal Deer, MD.    Allergies: Patient has no known allergies.  Social History   Tobacco Use  . Smoking status: Current Every Day Smoker    Packs/day: 0.50    Types: Cigarettes  . Smokeless tobacco: Never Used  Vaping Use  . Vaping Use: Never used  Substance Use Topics  . Alcohol use: Yes  . Drug use: No    Family History  Problem Relation Age of Onset  . CAD Mother   . CAD Father     Review of Systems: A 12-system review of systems was performed and was negative except as noted in the  HPI.  --------------------------------------------------------------------------------------------------  Physical Exam: There were no vitals taken for this visit.  General:  *** HEENT: No conjunctival pallor or scleral icterus. Facemask in place. Neck: Supple without lymphadenopathy, thyromegaly, JVD, or HJR. Lungs: Normal work of breathing. Clear to auscultation bilaterally without wheezes or crackles. Heart: Regular rate and rhythm without murmurs, rubs, or gallops. Non-displaced PMI. Abd: Bowel sounds present. Soft, NT/ND without hepatosplenomegaly Ext: No lower extremity edema. Radial, PT, and DP pulses are 2+ bilaterally. Skin: Warm and dry without rash.  EKG:  ***  Lab Results  Component Value Date   WBC 8.6 05/22/2019   HGB 12.8 05/22/2019   HCT 35.5 (L) 05/22/2019   MCV 84.3 05/22/2019   PLT 275 05/22/2019    Lab Results  Component Value Date   NA 138 05/22/2019   K 4.1 05/22/2019   CL 105 05/22/2019   CO2 26 05/22/2019   BUN 11 05/22/2019   CREATININE 0.71 05/22/2019   GLUCOSE 94 05/22/2019   ALT 11 01/11/2018    Lab Results  Component Value Date   CHOL 180 05/23/2019   HDL 45 05/23/2019   LDLCALC 122 (H) 05/23/2019   TRIG 65 05/23/2019   CHOLHDL 4.0 05/23/2019    --------------------------------------------------------------------------------------------------  ASSESSMENT AND PLAN: Cristal Deer Ceasar Decandia, MD 12/06/2019 8:26 AM

## 2019-12-08 ENCOUNTER — Encounter: Payer: Self-pay | Admitting: Internal Medicine

## 2020-11-02 ENCOUNTER — Emergency Department: Payer: BC Managed Care – PPO

## 2020-11-02 ENCOUNTER — Encounter: Payer: Self-pay | Admitting: Intensive Care

## 2020-11-02 ENCOUNTER — Other Ambulatory Visit: Payer: Self-pay

## 2020-11-02 ENCOUNTER — Emergency Department
Admission: EM | Admit: 2020-11-02 | Discharge: 2020-11-02 | Disposition: A | Discharge status: Home / Self Care | Payer: BC Managed Care – PPO | Attending: Student in an Organized Health Care Education/Training Program | Admitting: Student in an Organized Health Care Education/Training Program

## 2020-11-02 DIAGNOSIS — R509 Fever, unspecified: Secondary | ICD-10-CM | POA: Diagnosis present

## 2020-11-02 DIAGNOSIS — F17210 Nicotine dependence, cigarettes, uncomplicated: Secondary | ICD-10-CM | POA: Insufficient documentation

## 2020-11-02 DIAGNOSIS — Z28310 Unvaccinated for covid-19: Secondary | ICD-10-CM | POA: Diagnosis not present

## 2020-11-02 DIAGNOSIS — U071 COVID-19: Secondary | ICD-10-CM | POA: Diagnosis not present

## 2020-11-02 DIAGNOSIS — I10 Essential (primary) hypertension: Secondary | ICD-10-CM | POA: Insufficient documentation

## 2020-11-02 DIAGNOSIS — Z79899 Other long term (current) drug therapy: Secondary | ICD-10-CM | POA: Diagnosis not present

## 2020-11-02 DIAGNOSIS — J069 Acute upper respiratory infection, unspecified: Secondary | ICD-10-CM | POA: Insufficient documentation

## 2020-11-02 LAB — RESP PANEL BY RT-PCR (FLU A&B, COVID) ARPGX2
Influenza A by PCR: NEGATIVE
Influenza B by PCR: NEGATIVE
SARS Coronavirus 2 by RT PCR: POSITIVE — AB

## 2020-11-02 MED ORDER — BENZONATATE 100 MG PO CAPS: ORAL_CAPSULE | ORAL | 0 refills | Status: DC

## 2020-11-02 MED ORDER — BENZONATATE 100 MG PO CAPS
200.0000 mg | ORAL_CAPSULE | Freq: Once | ORAL | Status: AC
  Administered 2020-11-02: 200 mg via ORAL
  Filled 2020-11-02: qty 2

## 2020-11-02 MED ORDER — ONDANSETRON 4 MG PO TBDP: 4.0000 mg | ORAL_TABLET | Freq: Three times a day (TID) | ORAL | 0 refills | Status: DC | PRN

## 2020-11-02 MED ORDER — ONDANSETRON 4 MG PO TBDP
4.0000 mg | ORAL_TABLET | Freq: Once | ORAL | Status: AC
  Administered 2020-11-02: 4 mg via ORAL
  Filled 2020-11-02: qty 1

## 2020-11-02 MED ORDER — ACETAMINOPHEN 325 MG PO TABS
650.0000 mg | ORAL_TABLET | Freq: Once | ORAL | Status: AC
  Administered 2020-11-02: 650 mg via ORAL
  Filled 2020-11-02: qty 2

## 2020-11-02 NOTE — ED Provider Notes (Signed)
[facility] Emergency Department Provider Note ____________________________________________  Time seen: 1654  I have reviewed the triage vital signs and the nursing notes.  HISTORY  Chief Complaint  Fever and Chills   HPI Ana Cannon is a 50 y.o. female with the below medical history, presents to the ED for evaluation of 1 week complaint of fevers, chills, and malaise.  Patient also reports decreased appetite since Friday. She has not been vaccinated against covid or influenza.  Past Medical History:  Diagnosis Date   Atypical chest pain    a. 05/2019 MV: EF>65%. No ischemia/scar-->treated w/ NSAIDS - ? pericarditis.   Diastolic dysfunction    a. 05/2019 Echo: EF 65-70%, mild LVH. Gr2 DD. No rwma. Nl RV size/fxn. Sm pericardial effusion. Triv MR.   Hyperlipemia    Hypertension    Tobacco abuse     Patient Active Problem List   Diagnosis Date Noted   Generalized anxiety disorder    Acute pericarditis    HTN (hypertension), benign    Chest pain 05/22/2019   Palpitations    Elevated troponin     History reviewed. No pertinent surgical history.  Prior to Admission medications   Medication Sig Start Date End Date Taking? Authorizing Provider  benzonatate (TESSALON PERLES) 100 MG capsule Take 1-2 tabs TID prn cough 11/02/20  Yes Constantine Ruddick, Charlesetta Ivory, PA-C  ondansetron (ZOFRAN ODT) 4 MG disintegrating tablet Take 1 tablet (4 mg total) by mouth every 8 (eight) hours as needed. 11/02/20  Yes Emillia Weatherly, Charlesetta Ivory, PA-C  aluminum-magnesium hydroxide-simethicone (MAALOX) 200-200-20 MG/5ML SUSP Take 30 mLs by mouth 4 (four) times daily -  before meals and at bedtime. 01/11/18   Sharman Cheek, MD  clonazePAM (KLONOPIN) 0.5 MG tablet Take 0.5 mg by mouth 2 (two) times daily as needed. 05/26/19   [provider]  escitalopram (LEXAPRO) 5 MG tablet Take 1 tablet (5 mg total) by mouth daily. 05/23/19 06/22/19  Charise Killian, MD  famotidine  (PEPCID) 20 MG tablet Take 1 tablet (20 mg total) by mouth 2 (two) times daily. 01/11/18   Sharman Cheek, MD  metoCLOPramide (REGLAN) 10 MG tablet Take 1 tablet (10 mg total) by mouth every 6 (six) hours as needed. 01/11/18   Sharman Cheek, MD  metoprolol tartrate (LOPRESSOR) 50 MG tablet Take 1 tablet (50 mg total) by mouth 2 (two) times daily. 06/07/19 09/05/19  Creig Hines, NP  mirtazapine (REMERON) 30 MG tablet Take 30 mg by mouth at bedtime. 05/26/19   [provider]  naproxen (NAPROSYN) 500 MG tablet Take 500 mg by mouth 2 (two) times daily with a meal.     [provider]  pantoprazole (PROTONIX) 20 MG tablet Take 1 tablet (20 mg total) by mouth daily. 05/23/19 06/22/19  Charise Killian, MD    Allergies Patient has no known allergies.  Family History  Problem Relation Age of Onset   CAD Mother    CAD Father     Social History Social History   Tobacco Use   Smoking status: Every Day    Packs/day: 0.50    Pack years: 0.00    Types: Cigarettes   Smokeless tobacco: Never  Vaping Use   Vaping Use: Never used  Substance Use Topics   Alcohol use: Not Currently   Drug use: No    Review of Systems  Constitutional: Positive for fever and chills. Eyes: Negative for visual changes. ENT: Negative for sore throat. Cardiovascular: Negative for chest  pain. Respiratory: Negative for shortness of breath. Gastrointestinal: Negative for abdominal pain, vomiting and diarrhea. Genitourinary: Negative for dysuria. Musculoskeletal: Negative for back pain. Skin: Negative for rash. Neurological: Negative for headaches, focal weakness or numbness. ____________________________________________  PHYSICAL EXAM:  VITAL SIGNS: ED Triage Vitals  Enc Vitals Group     BP 11/02/20 1528 (!) 161/102     Pulse Rate 11/02/20 1523 79     Resp 11/02/20 1523 18     Temp 11/02/20 1523 (!) 100.4 F (38 C)     Temp Source 11/02/20 1523 Oral     SpO2 11/02/20 1523  98 %     Weight 11/02/20 1524 127 lb (57.6 kg)     Height 11/02/20 1524 5\' 7"  (1.702 m)     Head Circumference --      Peak Flow --      Pain Score 11/02/20 1523 0     Pain Loc --      Pain Edu? --      Excl. in GC? --     Constitutional: Alert and oriented. Well appearing and in no distress. Head: Normocephalic and atraumatic. Eyes: Conjunctivae are normal. PERRL. Normal extraocular movements Ears: Canals clear. TMs intact bilaterally. Nose: No congestion/rhinorrhea/epistaxis. Mouth/Throat: Mucous membranes are moist. Neck: Supple. No thyromegaly. Hematological/Lymphatic/Immunological: No cervical lymphadenopathy. Cardiovascular: Normal rate, regular rhythm. Normal distal pulses. Respiratory: Normal respiratory effort. No wheezes/rales/rhonchi. Gastrointestinal: Soft and nontender. No distention. Musculoskeletal: Nontender with normal range of motion in all extremities.  Neurologic:  Normal gait without ataxia. Normal speech and language. No gross focal neurologic deficits are appreciated. Skin:  Skin is warm, dry and intact. No rash noted. Psychiatric: Mood and affect are normal. Patient exhibits appropriate insight and judgment. ____________________________________________   LABS (pertinent positives/negatives)  Labs Reviewed  RESP PANEL BY RT-PCR (FLU A&B, COVID) ARPGX2 - Abnormal; Notable for the following components:      Result Value   SARS Coronavirus 2 by RT PCR POSITIVE (*)    All other components within normal limits  ____________________________________________   RADIOLOGY  CXR   IMPRESSION: Bronchial thickening without pneumonia. ____________________________________________  PROCEDURES  Zofran 4 mg ODT Tessalon capsule 200 mg p.o. Acetaminophen 650 mg p.o.  Procedures ____________________________________________   INITIAL IMPRESSION / ASSESSMENT AND PLAN / ED COURSE  As part of my medical decision making, I reviewed the following data within the  electronic MEDICAL RECORD NUMBER Labs reviewed as noted, Radiograph reviewed  WNL, and Notes from prior ED visits    DDX: Covid, influenza, CAP  Patient ED evaluation of fevers, cough, body aches.  She is evaluated for complaints in the ED and found to have a positive COVID viral panel.  Chest x-ray is negative as it shows no acute pneumonia but does reveal some bronchitic changes.  Patient will be discharged with instructions to continue to manage fevers over-the-counter with Tylenol and Motrin.  Prescriptions for Tessalon Perle as well as Zofran provided for benefit.  Work note was given to hold her out of work for the next 5 days.  Return precautions have been reviewed.  Ana Cannon was evaluated in Emergency Department on 11/06/2020 for the symptoms described in the history of present illness. She was evaluated in the context of the global COVID-19 pandemic, which necessitated consideration that the patient might be at risk for infection with the SARS-CoV-2 virus that causes COVID-19. Institutional protocols and algorithms that pertain to the evaluation of patients at risk for COVID-19 are in a state of  rapid change based on information released by regulatory bodies including the CDC and federal and state organizations. These policies and algorithms were followed during the patient's care in the ED. ____________________________________________  FINAL CLINICAL IMPRESSION(S) / ED DIAGNOSES  Final diagnoses:  Viral URI with cough  COVID-19      Lissa Hoard, PA-C 11/06/20 1204    Willy Eddy, MD 11/06/20 1306

## 2020-11-02 NOTE — Discharge Instructions (Addendum)
Take the prescription nausea medicine and cough medicine as directed.  You may take over-the-counter Delsym syrup for additional cough relief.  Continue to take alternating doses of Tylenol and Motrin for your body aches any fevers.  Follow-up with primary provider for ongoing symptoms and return to the ED for worsening symptoms.

## 2020-11-02 NOTE — ED Triage Notes (Signed)
Patient c/o fever, chills, and no appetite since Friday

## 2020-11-02 NOTE — ED Notes (Signed)
Pt is requesting for another warm blanket. This tech made the pt aware that due to her fever there could be a chance to only let her have the two blankets pt already has.

## 2021-02-17 ENCOUNTER — Emergency Department: Payer: BC Managed Care – PPO

## 2021-02-17 ENCOUNTER — Other Ambulatory Visit: Payer: Self-pay

## 2021-02-17 ENCOUNTER — Emergency Department
Admission: EM | Admit: 2021-02-17 | Discharge: 2021-02-17 | Disposition: A | Discharge status: Home / Self Care | Payer: BC Managed Care – PPO | Attending: Emergency Medicine | Admitting: Emergency Medicine

## 2021-02-17 DIAGNOSIS — T148XXA Other injury of unspecified body region, initial encounter: Secondary | ICD-10-CM

## 2021-02-17 DIAGNOSIS — M545 Low back pain, unspecified: Secondary | ICD-10-CM | POA: Diagnosis not present

## 2021-02-17 DIAGNOSIS — Z79899 Other long term (current) drug therapy: Secondary | ICD-10-CM | POA: Diagnosis not present

## 2021-02-17 DIAGNOSIS — M79652 Pain in left thigh: Secondary | ICD-10-CM | POA: Insufficient documentation

## 2021-02-17 DIAGNOSIS — M79605 Pain in left leg: Secondary | ICD-10-CM

## 2021-02-17 DIAGNOSIS — I10 Essential (primary) hypertension: Secondary | ICD-10-CM | POA: Insufficient documentation

## 2021-02-17 DIAGNOSIS — F17210 Nicotine dependence, cigarettes, uncomplicated: Secondary | ICD-10-CM | POA: Diagnosis not present

## 2021-02-17 MED ORDER — NAPROXEN 500 MG PO TABS
500.0000 mg | ORAL_TABLET | Freq: Once | ORAL | Status: AC
  Administered 2021-02-17: 500 mg via ORAL
  Filled 2021-02-17: qty 1

## 2021-02-17 MED ORDER — PREDNISONE 50 MG PO TABS: 50.0000 mg | ORAL_TABLET | Freq: Every day | ORAL | 0 refills | Status: DC

## 2021-02-17 MED ORDER — NAPROXEN 500 MG PO TABS: 500.0000 mg | ORAL_TABLET | Freq: Two times a day (BID) | ORAL | 2 refills | Status: DC

## 2021-02-17 MED ORDER — METHOCARBAMOL 500 MG PO TABS: 500.0000 mg | ORAL_TABLET | Freq: Three times a day (TID) | ORAL | 0 refills | Status: DC

## 2021-02-17 NOTE — ED Provider Notes (Signed)
Emergency Medicine Provider Triage Evaluation Note  Ana Cannon , a 50 y.o. female  was evaluated in triage.  Pt complains of acute low back pain with left leg pain.  Denies any injury, trauma, fall or Ana Cannon symptoms onset last night and noted difficulty moving her hip.  She denies any bladder bowel incontinence, foot drop, saddle anesthesia.  Patient has no history of chronic ongoing low back pain.  Review of Systems  Positive: LBP w/ LLE referral  Negative: Flank pain  Physical Exam  BP (!) 142/92 (BP Location: Left Arm)   Pulse 86   Temp 98.7 F (37.1 C) (Oral)   Resp 18   SpO2 100%  Gen:   Awake, no distress  NAD Resp:  Normal effort CTA MSK:   Moves extremities without difficulty LLE thigh pain  Other:  NEURO: normal LE DTRs bilaterally  Medical Decision Making  Medically screening exam initiated at 5:14 PM.  Appropriate orders placed.  Ana Cannon was informed that the remainder of the evaluation will be completed by another provider, this initial triage assessment does not replace that evaluation, and the importance of remaining in the ED until their evaluation is complete.  ED evaluation of acute low back pain with left lower extremity referral.   Ana Hoard, PA-C 02/17/21 1715    Ana Hacking, MD 02/17/21 1944

## 2021-02-17 NOTE — ED Provider Notes (Signed)
[facility] Emergency Department Provider Note   ____________________________________________    I have reviewed the triage vital signs and the nursing notes.   HISTORY  Chief Complaint Leg Pain     HPI Ana Cannon is a 50 y.o. female with history as noted below who presents with complaints of left back and thigh pain.  Patient reports pain developed this morning.  No numbness or weakness reported.  Occasionally has electrical type pain to the toe on the left.  Has never had this before.  No trauma or falls.  No fevers chills or dysuria  Past Medical History:  Diagnosis Date   Atypical chest pain    a. 05/2019 MV: EF>65%. No ischemia/scar-->treated w/ NSAIDS - ? pericarditis.   Diastolic dysfunction    a. 05/2019 Echo: EF 65-70%, mild LVH. Gr2 DD. No rwma. Nl RV size/fxn. Sm pericardial effusion. Triv MR.   Hyperlipemia    Hypertension    Tobacco abuse     Patient Active Problem List   Diagnosis Date Noted   Generalized anxiety disorder    Acute pericarditis    HTN (hypertension), benign    Chest pain 05/22/2019   Palpitations    Elevated troponin     History reviewed. No pertinent surgical history.  Prior to Admission medications   Medication Sig Start Date End Date Taking? Authorizing Provider  methocarbamol (ROBAXIN) 500 MG tablet Take 1 tablet (500 mg total) by mouth 3 (three) times daily. 02/17/21  Yes Jene Every, MD  naproxen (NAPROSYN) 500 MG tablet Take 1 tablet (500 mg total) by mouth 2 (two) times daily with a meal. 02/17/21  Yes Jene Every, MD  predniSONE (DELTASONE) 50 MG tablet Take 1 tablet (50 mg total) by mouth daily with breakfast. 02/17/21  Yes Jene Every, MD  aluminum-magnesium hydroxide-simethicone (MAALOX) 200-200-20 MG/5ML SUSP Take 30 mLs by mouth 4 (four) times daily -  before meals and at bedtime. 01/11/18   Sharman Cheek, MD  benzonatate (TESSALON PERLES) 100 MG capsule Take 1-2 tabs TID prn cough  11/02/20   Menshew, Charlesetta Ivory, PA-C  clonazePAM (KLONOPIN) 0.5 MG tablet Take 0.5 mg by mouth 2 (two) times daily as needed. 05/26/19   [provider]  escitalopram (LEXAPRO) 5 MG tablet Take 1 tablet (5 mg total) by mouth daily. 05/23/19 06/22/19  Charise Killian, MD  famotidine (PEPCID) 20 MG tablet Take 1 tablet (20 mg total) by mouth 2 (two) times daily. 01/11/18   Sharman Cheek, MD  metoCLOPramide (REGLAN) 10 MG tablet Take 1 tablet (10 mg total) by mouth every 6 (six) hours as needed. 01/11/18   Sharman Cheek, MD  metoprolol tartrate (LOPRESSOR) 50 MG tablet Take 1 tablet (50 mg total) by mouth 2 (two) times daily. 06/07/19 09/05/19  Creig Hines, NP  mirtazapine (REMERON) 30 MG tablet Take 30 mg by mouth at bedtime. 05/26/19   [provider]  ondansetron (ZOFRAN ODT) 4 MG disintegrating tablet Take 1 tablet (4 mg total) by mouth every 8 (eight) hours as needed. 11/02/20   Menshew, Charlesetta Ivory, PA-C  pantoprazole (PROTONIX) 20 MG tablet Take 1 tablet (20 mg total) by mouth daily. 05/23/19 06/22/19  Charise Killian, MD     Allergies Patient has no known allergies.  Family History  Problem Relation Age of Onset   CAD Mother    CAD Father     Social History Social History   Tobacco Use   Smoking status: Every  Day    Packs/day: 0.50    Types: Cigarettes   Smokeless tobacco: Never  Vaping Use   Vaping Use: Never used  Substance Use Topics   Alcohol use: Not Currently   Drug use: No    Review of Systems  Constitutional: No fever/chills  ENT: No sore throat.   Gastrointestinal: No abdominal pain.  No nausea, no vomiting.   Genitourinary: Negative for dysuria. Musculoskeletal: As above Skin: Negative for rash. Neurological: Negative for headaches     ____________________________________________   PHYSICAL EXAM:  VITAL SIGNS: ED Triage Vitals  Enc Vitals Group     BP 02/17/21 1711 (!) 142/92     Pulse Rate 02/17/21 1711 86      Resp 02/17/21 1711 18     Temp 02/17/21 1711 98.7 F (37.1 C)     Temp Source 02/17/21 1711 Oral     SpO2 02/17/21 1711 100 %     Weight 02/17/21 1827 57.6 kg (126 lb 15.8 oz)     Height 02/17/21 1827 1.702 m (5\' 7" )     Head Circumference --      Peak Flow --      Pain Score 02/17/21 1709 10     Pain Loc --      Pain Edu? --      Excl. in GC? --      Constitutional: Alert and oriented. No acute distress. Pleasant and interactive Eyes: Conjunctivae are normal.  Head: Atraumatic. Nose: No congestion/rhinnorhea. Mouth/Throat: Mucous membranes are moist.   Cardiovascular: Normal rate, regular rhythm.  Respiratory: Normal respiratory effort.  No retractions. Genitourinary: deferred Musculoskeletal: Normal range of motion of the left leg, no pain with axial load on the left hip.  No swelling or bruising, warm and well-perfused.  No vertebral tenderness to palpation, mild left lumbar paraspinal tenderness to palpation consistent with muscle spasm Neurologic:  Normal speech and language. No gross focal neurologic deficits are appreciated.   Skin:  Skin is warm, dry and intact. No rash noted.   ____________________________________________   LABS (all labs ordered are listed, but only abnormal results are displayed)  Labs Reviewed - No data to display ____________________________________________  EKG   ____________________________________________  RADIOLOGY   ____________________________________________   PROCEDURES  Procedure(s) performed: No  Procedures   Critical Care performed: No ____________________________________________   INITIAL IMPRESSION / ASSESSMENT AND PLAN / ED COURSE  Pertinent labs & imaging results that were availab  We will try conservative treatment, outpatient follow-up with Ortho recommended.le during my care of the patient were reviewed by me and considered in my medical decision making (see chart for details).   Patient presents with  symptoms of left lower back pain, some left thigh pain as well, suspicious for sciatica, normal strength and sensation, no saddle anesthesia.   ____________________________________________   FINAL CLINICAL IMPRESSION(S) / ED DIAGNOSES  Final diagnoses:  Left leg pain  Muscle strain      NEW MEDICATIONS STARTED DURING THIS VISIT:  Discharge Medication List as of 02/17/2021  6:45 PM     START taking these medications   Details  methocarbamol (ROBAXIN) 500 MG tablet Take 1 tablet (500 mg total) by mouth 3 (three) times daily., Starting Mon 02/17/2021, Normal    predniSONE (DELTASONE) 50 MG tablet Take 1 tablet (50 mg total) by mouth daily with breakfast., Starting Mon 02/17/2021, Normal         Note:  This document was prepared using Dragon voice recognition software and may include unintentional  dictation errors.    Jene Every, MD 02/17/21 2135

## 2021-02-17 NOTE — ED Triage Notes (Signed)
Pt come with c/o left leg pain. Pt denies any injuries.

## 2021-02-23 ENCOUNTER — Emergency Department: Payer: BC Managed Care – PPO

## 2021-02-23 ENCOUNTER — Other Ambulatory Visit: Payer: Self-pay

## 2021-02-23 ENCOUNTER — Emergency Department
Admission: EM | Admit: 2021-02-23 | Discharge: 2021-02-23 | Disposition: A | Discharge status: Home / Self Care | Payer: BC Managed Care – PPO | Attending: Emergency Medicine | Admitting: Emergency Medicine

## 2021-02-23 DIAGNOSIS — F17210 Nicotine dependence, cigarettes, uncomplicated: Secondary | ICD-10-CM | POA: Diagnosis not present

## 2021-02-23 DIAGNOSIS — Z79899 Other long term (current) drug therapy: Secondary | ICD-10-CM | POA: Diagnosis not present

## 2021-02-23 DIAGNOSIS — M5416 Radiculopathy, lumbar region: Secondary | ICD-10-CM | POA: Diagnosis not present

## 2021-02-23 DIAGNOSIS — I10 Essential (primary) hypertension: Secondary | ICD-10-CM | POA: Diagnosis not present

## 2021-02-23 DIAGNOSIS — M79604 Pain in right leg: Secondary | ICD-10-CM | POA: Diagnosis present

## 2021-02-23 MED ORDER — HYDROCODONE-ACETAMINOPHEN 5-325 MG PO TABS
1.0000 | ORAL_TABLET | Freq: Once | ORAL | Status: AC
  Administered 2021-02-23: 1 via ORAL
  Filled 2021-02-23: qty 1

## 2021-02-23 MED ORDER — ONDANSETRON 4 MG PO TBDP
4.0000 mg | ORAL_TABLET | Freq: Once | ORAL | Status: AC
  Administered 2021-02-23: 4 mg via ORAL
  Filled 2021-02-23: qty 1

## 2021-02-23 MED ORDER — HYDROCODONE-ACETAMINOPHEN 5-325 MG PO TABS: 1.0000 | ORAL_TABLET | ORAL | 0 refills | Status: DC | PRN

## 2021-02-23 NOTE — Discharge Instructions (Addendum)
Continue taking steroid prescribed by PCP. Please follow-up with neurosurgery, Dr. Marcell Barlow.

## 2021-02-23 NOTE — ED Provider Notes (Signed)
[facility]-EMERGENCY DEPARTMENT  ____________________________________________  Time seen: Approximately 3:20 PM  I have reviewed the triage vital signs and the nursing notes.   HISTORY  Chief Complaint Back Pain   Historian Patient     HPI Ana Cannon is a 50 y.o. female presents to the emergency department for the first time to be evaluated for sciatica of the right side.  This is the third time patient has been evaluated for lumbar radiculopathy in the past 6 weeks.  Patient reports that her right leg pain has become progressive and patient states that she can barely move her right leg.  She endorses weakness at home.  She denies bowel or bladder incontinence or saddle anesthesia.  She has been on both prednisone and Solu-Medrol at home and states that her symptoms or not improving.  She has not been under the care of a neurosurgeon yet.  No recent falls or other mechanisms of trauma to her knowledge.  No fever or chills at home.   Past Medical History:  Diagnosis Date   Atypical chest pain    a. 05/2019 MV: EF>65%. No ischemia/scar-->treated w/ NSAIDS - ? pericarditis.   Diastolic dysfunction    a. 05/2019 Echo: EF 65-70%, mild LVH. Gr2 DD. No rwma. Nl RV size/fxn. Sm pericardial effusion. Triv MR.   Hyperlipemia    Hypertension    Tobacco abuse      Immunizations up to date:  Yes.     Past Medical History:  Diagnosis Date   Atypical chest pain    a. 05/2019 MV: EF>65%. No ischemia/scar-->treated w/ NSAIDS - ? pericarditis.   Diastolic dysfunction    a. 05/2019 Echo: EF 65-70%, mild LVH. Gr2 DD. No rwma. Nl RV size/fxn. Sm pericardial effusion. Triv MR.   Hyperlipemia    Hypertension    Tobacco abuse     Patient Active Problem List   Diagnosis Date Noted   Generalized anxiety disorder    Acute pericarditis    HTN (hypertension), benign    Chest pain 05/22/2019   Palpitations    Elevated troponin     History reviewed. No pertinent surgical history.  Prior  to Admission medications   Medication Sig Start Date End Date Taking? Authorizing Provider  HYDROcodone-acetaminophen (NORCO/VICODIN) 5-325 MG tablet Take 1 tablet by mouth every 4 (four) hours as needed for moderate pain. 02/23/21 02/23/22 Yes Cuthriell, Delorise Royals, PA-C  aluminum-magnesium hydroxide-simethicone (MAALOX) 200-200-20 MG/5ML SUSP Take 30 mLs by mouth 4 (four) times daily -  before meals and at bedtime. 01/11/18   Sharman Cheek, MD  benzonatate (TESSALON PERLES) 100 MG capsule Take 1-2 tabs TID prn cough 11/02/20   Menshew, Charlesetta Ivory, PA-C  clonazePAM (KLONOPIN) 0.5 MG tablet Take 0.5 mg by mouth 2 (two) times daily as needed. 05/26/19   [provider]  escitalopram (LEXAPRO) 5 MG tablet Take 1 tablet (5 mg total) by mouth daily. 05/23/19 06/22/19  Charise Killian, MD  famotidine (PEPCID) 20 MG tablet Take 1 tablet (20 mg total) by mouth 2 (two) times daily. 01/11/18   Sharman Cheek, MD  methocarbamol (ROBAXIN) 500 MG tablet Take 1 tablet (500 mg total) by mouth 3 (three) times daily. 02/17/21   Jene Every, MD  metoCLOPramide (REGLAN) 10 MG tablet Take 1 tablet (10 mg total) by mouth every 6 (six) hours as needed. 01/11/18   Sharman Cheek, MD  metoprolol tartrate (LOPRESSOR) 50 MG tablet Take 1 tablet (50 mg total) by mouth 2 (two) times daily. 06/07/19  09/05/19  Creig Hines, NP  mirtazapine (REMERON) 30 MG tablet Take 30 mg by mouth at bedtime. 05/26/19   [provider]  naproxen (NAPROSYN) 500 MG tablet Take 1 tablet (500 mg total) by mouth 2 (two) times daily with a meal. 02/17/21   Jene Every, MD  ondansetron (ZOFRAN ODT) 4 MG disintegrating tablet Take 1 tablet (4 mg total) by mouth every 8 (eight) hours as needed. 11/02/20   Menshew, Charlesetta Ivory, PA-C  pantoprazole (PROTONIX) 20 MG tablet Take 1 tablet (20 mg total) by mouth daily. 05/23/19 06/22/19  Charise Killian, MD  predniSONE (DELTASONE) 50 MG tablet Take 1 tablet (50 mg total)  by mouth daily with breakfast. 02/17/21   Jene Every, MD    Allergies Patient has no known allergies.  Family History  Problem Relation Age of Onset   CAD Mother    CAD Father     Social History Social History   Tobacco Use   Smoking status: Every Day    Packs/day: 0.50    Types: Cigarettes   Smokeless tobacco: Never  Vaping Use   Vaping Use: Never used  Substance Use Topics   Alcohol use: Not Currently   Drug use: No     Review of Systems  Constitutional: No fever/chills Eyes:  No discharge ENT: No upper respiratory complaints. Respiratory: no cough. No SOB/ use of accessory muscles to breath Gastrointestinal:   No nausea, no vomiting.  No diarrhea.  No constipation. Musculoskeletal: Patient has low back pain.  Skin: Negative for rash, abrasions, lacerations, ecchymosis.   ____________________________________________   PHYSICAL EXAM:  VITAL SIGNS: ED Triage Vitals [02/23/21 1447]  Enc Vitals Group     BP 133/90     Pulse Rate 94     Resp 16     Temp 97.9 F (36.6 C)     Temp src      SpO2 100 %     Weight 128 lb (58.1 kg)     Height 5\' 7"  (1.702 m)     Head Circumference      Peak Flow      Pain Score      Pain Loc      Pain Edu?      Excl. in GC?      Constitutional: Alert and oriented. Well appearing and in no acute distress. Eyes: Conjunctivae are normal. PERRL. EOMI. Head: Atraumatic. ENT:      Nose: No congestion/rhinnorhea.      Mouth/Throat: Mucous membranes are moist.  Neck: No stridor.  No cervical spine tenderness to palpation. Cardiovascular: Normal rate, regular rhythm. Normal S1 and S2.  Good peripheral circulation. Respiratory: Normal respiratory effort without tachypnea or retractions. Lungs CTAB. Good air entry to the bases with no decreased or absent breath sounds Gastrointestinal: Bowel sounds x 4 quadrants. Soft and nontender to palpation. No guarding or rigidity. No distention. Musculoskeletal: Patient has symmetric  strength in the upper and lower extremities.  Full range of motion to all extremities. No obvious deformities noted.  Positive straight leg raise on the right. Neurologic:  Normal for age. No gross focal neurologic deficits are appreciated.  Skin:  Skin is warm, dry and intact. No rash noted. Psychiatric: Mood and affect are normal for age. Speech and behavior are normal.   ____________________________________________   LABS (all labs ordered are listed, but only abnormal results are displayed)  Labs Reviewed - No data to display ____________________________________________  EKG   ____________________________________________  RADIOLOGY  I, Orvil Feil, personally viewed and evaluated these images (plain radiographs) as part of my medical decision making, as well as reviewing the written report by the radiologist.    MR LUMBAR SPINE WO CONTRAST  Result Date: 02/23/2021 CLINICAL DATA:  Low back pain. EXAM: MRI LUMBAR SPINE WITHOUT CONTRAST TECHNIQUE: Multiplanar, multisequence MR imaging of the lumbar spine was performed. No intravenous contrast was administered. COMPARISON:  None. FINDINGS: Segmentation:  Standard. Alignment:  Physiologic. Vertebrae: No acute fracture, evidence of discitis, or aggressive bone lesion. Conus medullaris and cauda equina: Conus extends to the L1 level. Conus and cauda equina appear normal. Paraspinal and other soft tissues: No acute paraspinal abnormality. Disc levels: Disc spaces: Disc spaces are preserved. T12-L1: No significant disc bulge. No neural foraminal stenosis. No central canal stenosis. L1-L2: No significant disc bulge. No neural foraminal stenosis. No central canal stenosis. L2-L3: No significant disc bulge. No neural foraminal stenosis. No central canal stenosis. L3-L4: Small left foraminal disc protrusion. No foraminal or central canal stenosis. L4-L5: Mild broad-based disc bulge with a left foraminal disc protrusion contacting the left L4 nerve  root. Mild bilateral foraminal narrowing. No spinal stenosis. Mild bilateral facet extra-spinal arthropathy with a 10 mm left facet synovial cyst. L5-S1: No significant disc bulge. No neural foraminal stenosis. No central canal stenosis. IMPRESSION: 1. At L4-5 there is a mild broad-based disc bulge with a left foraminal disc protrusion contacting the left L4 nerve root. Mild bilateral foraminal narrowing. No spinal stenosis. Mild bilateral facet extra-spinal arthropathy with a 10 mm left facet synovial cyst. 2. At L3-4 there is a small left foraminal disc protrusion. No foraminal or central canal stenosis. 3.  No acute osseous injury of the lumbar spine. Electronically Signed   By: Elige Ko M.D.   On: 02/23/2021 17:03    ____________________________________________    PROCEDURES  Procedure(s) performed:     Procedures     Medications  HYDROcodone-acetaminophen (NORCO/VICODIN) 5-325 MG per tablet 1 tablet (1 tablet Oral Given 02/23/21 1742)  ondansetron (ZOFRAN-ODT) disintegrating tablet 4 mg (4 mg Oral Given 02/23/21 1742)     ____________________________________________   INITIAL IMPRESSION / ASSESSMENT AND PLAN / ED COURSE  Pertinent labs & imaging results that were available during my care of the patient were reviewed by me and considered in my medical decision making (see chart for details).    Assessment and plan Lumbar radiculopathy 50 year old female presents to the emergency department with low back pain that radiates on the posterior aspect of the right lower extremity and has been previously seen 2 separate times and is in the process of taking her second prescription of steroids.  MRI of the lumbar spine shows small bulging disc at L4-L5 but no signs of spinal stenosis.  Recommended continuing Solu-Medrol as prescribed by her PCP and gave patient a referral to neurosurgery.  I did prescribe patient 2 days of Vicodin and give her 1 dose of Norco while in the emergency  department.  Return precautions were given to return with new or worsening symptoms.  All patient questions were answered.      ____________________________________________  FINAL CLINICAL IMPRESSION(S) / ED DIAGNOSES  Final diagnoses:  Lumbar radiculopathy      NEW MEDICATIONS STARTED DURING THIS VISIT:  ED Discharge Orders          Ordered    HYDROcodone-acetaminophen (NORCO/VICODIN) 5-325 MG tablet  Every 4 hours PRN        02/23/21 1753  This chart was dictated using voice recognition software/Dragon. Despite best efforts to proofread, errors can occur which can change the meaning. Any change was purely unintentional.     Orvil Feil, PA-C 02/23/21 2024    Gilles Chiquito, MD 02/24/21 [phone]

## 2021-02-23 NOTE — ED Triage Notes (Signed)
Pt c/o left lower back pain that radiates into the leg. States "its my sciatica". States she has been to her PCP and on medication but nothing is helping

## 2021-04-03 ENCOUNTER — Other Ambulatory Visit: Payer: Self-pay

## 2021-04-03 ENCOUNTER — Ambulatory Visit
Payer: BC Managed Care – PPO | Attending: Student in an Organized Health Care Education/Training Program | Admitting: Student in an Organized Health Care Education/Training Program

## 2021-04-03 ENCOUNTER — Encounter: Payer: Self-pay | Admitting: Student in an Organized Health Care Education/Training Program

## 2021-04-03 VITALS — BP 169/129 | HR 86 | Temp 98.7°F | Resp 18 | Ht 67.0 in | Wt 134.0 lb

## 2021-04-03 DIAGNOSIS — M5417 Radiculopathy, lumbosacral region: Secondary | ICD-10-CM | POA: Diagnosis not present

## 2021-04-03 DIAGNOSIS — M5416 Radiculopathy, lumbar region: Secondary | ICD-10-CM | POA: Diagnosis not present

## 2021-04-03 NOTE — Progress Notes (Signed)
Safety precautions to be maintained throughout the outpatient stay will include: orient to surroundings, keep bed in low position, maintain call bell within reach at all times, provide assistance with transfer out of bed and ambulation.  

## 2021-04-03 NOTE — Progress Notes (Signed)
Patient: Ana Cannon  Service Category: E/M  Provider: Gillis Santa, MD  DOB: 10-Jun-1970  DOS: 04/03/2021  Referring Provider: Doyle Askew, MD  MRN: [id number]  Setting: Ambulatory outpatient  PCP: Ana Jewett, MD  Type: New Patient  Specialty: Interventional Pain Management    Location: Office  Delivery: Face-to-face     Primary Reason(s) for Visit: Encounter for initial evaluation of one or more chronic problems (new to examiner) potentially causing chronic pain, and posing a threat to normal musculoskeletal function. (Level of risk: High) CC: Back Pain  HPI  [name] is a 50 y.o. year old, female patient, who comes for the first time to our practice referred by Ana Hake I, MD for our initial evaluation of her chronic pain. She has Chest pain; Palpitations; Elevated troponin; Generalized anxiety disorder; Acute pericarditis; HTN (hypertension), benign; Lumbosacral radiculopathy at L4 (left); and Lumbar radiculopathy (left) on their problem list. Today she comes in for evaluation of her Back Pain  Pain Assessment: Location: Left Back Radiating: Radiates from left lower back into back og left leg into back of left knee Onset: More than a month ago (pain started on 02/16/21) Duration: Chronic pain Quality: Constant, Aching, Sharp Severity: 7 /10 (subjective, self-reported pain score)  Effect on ADL: 'Hard to stand or sit long periods, limits with job, and have to be out of work now" Timing: Constant Modifying factors: Gabapentin, diclofenac, heat, and position changes BP: (!) 169/129  HR: 86  Onset and Duration: Sudden and Date of onset: 02/16/21 Cause of pain: Unknown Severity: Getting better, NAS-11 at its worse: 10/10, NAS-11 at its best: 10/10, and NAS-11 now: 7/10 Timing: Morning and Night Aggravating Factors: Bending, Kneeling, Motion, Prolonged sitting, Prolonged standing, Squatting, and Walking Alleviating Factors: Lying down, Resting, Sleeping, and  Warm showers or baths Associated Problems: Depression and Sadness Quality of Pain: Aching, Nagging, Sharp, and Shooting Previous Examinations or Tests: MRI scan Previous Treatments: Steroid treatments by mouth  Patient is a 50 year old female who is being referred from [name] clinic physical medicine rehab for lumbar epidural steroid injection with sedation under fluoroscopy.  She has severe low back pain that radiates into her left leg which started at the end of September.  No inciting or traumatic event.  She has tingling and numbness in her left leg.  She has been referred here by [name] for a left L4-L5 transforaminal epidural steroid injection under sedation.  She was unable to tolerate this procedure without sedation when it was attempted at [facility] PM&R.  Risks and benefits of epidural were reviewed in detail with patient.  She would like to proceed.  Of note she is out of work until December 14 by her primary care provider given her radicular pain flare upper lumbar region.  Cannon also recommend that she utilize orthotics in her steel boots when working in a lumbar back brace for support.   Meds   Current Outpatient Medications:    aluminum-magnesium hydroxide-simethicone (MAALOX) [id number] MG/5ML SUSP, Take 30 mLs by mouth 4 (four) times daily -  before meals and at bedtime., Disp: 355 mL, Rfl: 0   benzonatate (TESSALON PERLES) 100 MG capsule, Take 1-2 tabs TID prn cough, Disp: 30 capsule, Rfl: 0   clonazePAM (KLONOPIN) 0.5 MG tablet, Take 0.5 mg by mouth 2 (two) times daily as needed., Disp: , Rfl:    diclofenac (VOLTAREN) 75 MG EC tablet, Take 75 mg by mouth 2 (two) times daily., Disp: , Rfl:  famotidine (PEPCID) 20 MG tablet, Take 1 tablet (20 mg total) by mouth 2 (two) times daily., Disp: 60 tablet, Rfl: 0   gabapentin (NEURONTIN) 300 MG capsule, Take 300 mg by mouth once., Disp: , Rfl:    HYDROcodone-acetaminophen (NORCO/VICODIN) 5-325 MG tablet, Take 1 tablet by mouth  every 4 (four) hours as needed for moderate pain., Disp: 8 tablet, Rfl: 0   methocarbamol (ROBAXIN) 500 MG tablet, Take 1 tablet (500 mg total) by mouth 3 (three) times daily., Disp: 15 tablet, Rfl: 0   metoCLOPramide (REGLAN) 10 MG tablet, Take 1 tablet (10 mg total) by mouth every 6 (six) hours as needed., Disp: 30 tablet, Rfl: 0   mirtazapine (REMERON) 30 MG tablet, Take 30 mg by mouth at bedtime., Disp: , Rfl:    naproxen (NAPROSYN) 500 MG tablet, Take 1 tablet (500 mg total) by mouth 2 (two) times daily with a meal., Disp: 20 tablet, Rfl: 2   ondansetron (ZOFRAN ODT) 4 MG disintegrating tablet, Take 1 tablet (4 mg total) by mouth every 8 (eight) hours as needed., Disp: 15 tablet, Rfl: 0   predniSONE (DELTASONE) 50 MG tablet, Take 1 tablet (50 mg total) by mouth daily with breakfast., Disp: 4 tablet, Rfl: 0   escitalopram (LEXAPRO) 5 MG tablet, Take 1 tablet (5 mg total) by mouth daily., Disp: 30 tablet, Rfl: 0   metoprolol tartrate (LOPRESSOR) 50 MG tablet, Take 1 tablet (50 mg total) by mouth 2 (two) times daily., Disp: 180 tablet, Rfl: 3   pantoprazole (PROTONIX) 20 MG tablet, Take 1 tablet (20 mg total) by mouth daily., Disp: 30 tablet, Rfl: 0  Imaging Review    MR LUMBAR SPINE WO CONTRAST  Narrative CLINICAL DATA:  Low back pain.  EXAM: MRI LUMBAR SPINE WITHOUT CONTRAST  TECHNIQUE: Multiplanar, multisequence MR imaging of the lumbar spine was performed. No intravenous contrast was administered.  COMPARISON:  None.  FINDINGS: Segmentation:  Standard.  Alignment:  Physiologic.  Vertebrae: No acute fracture, evidence of discitis, or aggressive bone lesion.  Conus medullaris and cauda equina: Conus extends to the L1 level. Conus and cauda equina appear normal.  Paraspinal and other soft tissues: No acute paraspinal abnormality.  Disc levels:  Disc spaces: Disc spaces are preserved.  T12-L1: No significant disc bulge. No neural foraminal stenosis. No central canal  stenosis.  L1-L2: No significant disc bulge. No neural foraminal stenosis. No central canal stenosis.  L2-L3: No significant disc bulge. No neural foraminal stenosis. No central canal stenosis.  L3-L4: Small left foraminal disc protrusion. No foraminal or central canal stenosis.  L4-L5: Mild broad-based disc bulge with a left foraminal disc protrusion contacting the left L4 nerve root. Mild bilateral foraminal narrowing. No spinal stenosis. Mild bilateral facet extra-spinal arthropathy with a 10 mm left facet synovial cyst.  L5-S1: No significant disc bulge. No neural foraminal stenosis. No central canal stenosis.  IMPRESSION: 1. At L4-5 there is a mild broad-based disc bulge with a left foraminal disc protrusion contacting the left L4 nerve root. Mild bilateral foraminal narrowing. No spinal stenosis. Mild bilateral facet extra-spinal arthropathy with a 10 mm left facet synovial cyst. 2. At L3-4 there is a small left foraminal disc protrusion. No foraminal or central canal stenosis. 3.  No acute osseous injury of the lumbar spine.   Electronically Signed By: Kathreen Devoid M.D. On: 02/23/2021 17:03  Narrative CLINICAL DATA:  Chronic low back pain, no extension down the left leg  EXAM: LUMBAR SPINE - COMPLETE 4+ VIEW  COMPARISON:  None.  FINDINGS: 17 degrees of levoconvex thoracolumbar scoliosis as measured between T10 and L5. Minimal rotary component.  No fracture or subluxation is identified. Preserved intervertebral disc spaces. Minimal degenerative facet joint arthropathy at L5-S1.  IMPRESSION: 1. 17 degrees of levoconvex scoliosis as measured between T10 and L5. 2. Minimal degenerative facet arthropathy at L5-S1. 3. If pain persists despite conservative therapy, MRI may be warranted for further characterization.   Electronically Signed By: Van Clines M.D. On: 02/17/2021 18:17  Complexity Note: Imaging results reviewed. Results shared with Ms.  Piper, using Layman's terms.                         ROS  Cardiovascular: High blood pressure Pulmonary or Respiratory: Smoking Neurological: No reported neurological signs or symptoms such as seizures, abnormal skin sensations, urinary and/or fecal incontinence, being born with an abnormal open spine and/or a tethered spinal cord Psychological-Psychiatric: Anxiousness, Depressed, and Difficulty sleeping and or falling asleep Gastrointestinal: Reflux or heatburn Genitourinary: No reported renal or genitourinary signs or symptoms such as difficulty voiding or producing urine, peeing blood, non-functioning kidney, kidney stones, difficulty emptying the bladder, difficulty controlling the flow of urine, or chronic kidney disease Hematological: Abnormal red blood cell shape problems (Sickle Cell disease or trait) Endocrine: No reported endocrine signs or symptoms such as high or low blood sugar, rapid heart rate due to high thyroid levels, obesity or weight gain due to slow thyroid or thyroid disease Rheumatologic: No reported rheumatological signs and symptoms such as fatigue, joint pain, tenderness, swelling, redness, heat, stiffness, decreased range of motion, with or without associated rash Musculoskeletal: Negative for myasthenia gravis, muscular dystrophy, multiple sclerosis or malignant hyperthermia Work History: Out of work due to pain  Allergies  Ms. Vari has No Known Allergies.  Laboratory Chemistry Profile   Renal Lab Results  Component Value Date   BUN 11 05/22/2019   CREATININE 0.71 05/22/2019   GFRAA >60 05/22/2019   GFRNONAA >60 05/22/2019   PROTEINUR NEGATIVE 12/13/2016     Electrolytes Lab Results  Component Value Date   NA 138 05/22/2019   K 4.1 05/22/2019   CL 105 05/22/2019   CALCIUM 8.8 (L) 05/22/2019     Hepatic Lab Results  Component Value Date   AST 15 01/11/2018   ALT 11 01/11/2018   ALBUMIN 3.9 01/11/2018   ALKPHOS 53 01/11/2018   LIPASE 25  01/11/2018     ID Lab Results  Component Value Date   HIV NON REACTIVE 05/23/2019   SARSCOV2NAA POSITIVE (A) 11/02/2020   PREGTESTUR NEGATIVE 05/22/2019     Bone No results found for: VD25OH, [id number], MQ2863OT7, RN1657XU3, 25OHVITD1, 25OHVITD2, 25OHVITD3, TESTOFREE, TESTOSTERONE   Endocrine Lab Results  Component Value Date   GLUCOSE 94 05/22/2019   GLUCOSEU NEGATIVE 12/13/2016   HGBA1C 5.1 05/23/2019     Neuropathy Lab Results  Component Value Date   HGBA1C 5.1 05/23/2019   HIV NON REACTIVE 05/23/2019     CNS No results found for: COLORCSF, APPEARCSF, RBCCOUNTCSF, WBCCSF, POLYSCSF, LYMPHSCSF, EOSCSF, PROTEINCSF, GLUCCSF, JCVIRUS, CSFOLI, IGGCSF, LABACHR, ACETBL, LABACHR, ACETBL   Inflammation (CRP: Acute  ESR: Chronic) No results found for: CRP, ESRSEDRATE, LATICACIDVEN   Rheumatology No results found for: RF, ANA, LABURIC, URICUR, LYMEIGGIGMAB, LYMEABIGMQN, HLAB27   Coagulation Lab Results  Component Value Date   PLT 275 05/22/2019   DDIMER 0.90 (H) 05/22/2019     Cardiovascular Lab Results  Component Value Date   TROPONINI <  0.03 01/11/2018   HGB 12.8 05/22/2019   HCT 35.5 (L) 05/22/2019     Screening Lab Results  Component Value Date   SARSCOV2NAA POSITIVE (A) 11/02/2020   HIV NON REACTIVE 05/23/2019   PREGTESTUR NEGATIVE 05/22/2019     Cancer No results found for: CEA, CA125, LABCA2   Allergens No results found for: ALMOND, APPLE, ASPARAGUS, AVOCADO, BANANA, BARLEY, BASIL, BAYLEAF, GREENBEAN, LIMABEAN, WHITEBEAN, BEEFIGE, REDBEET, BLUEBERRY, BROCCOLI, CABBAGE, MELON, CARROT, CASEIN, CASHEWNUT, CAULIFLOWER, CELERY     Note: Lab results reviewed.  [place]  Drug: Ms. Milliner  reports no history of drug use. Alcohol:  reports that she does not currently use alcohol. Tobacco:  reports that she has been smoking cigarettes. She has been smoking an average of .5 packs per day. She has never used smokeless tobacco. Medical:  has a past medical history  of Atypical chest pain, Diastolic dysfunction, Hyperlipemia, Hypertension, and Tobacco abuse. Family: family history includes CAD in her father and mother.  No past surgical history on file. Active Ambulatory Problems    Diagnosis Date Noted   Chest pain 05/22/2019   Palpitations    Elevated troponin    Generalized anxiety disorder    Acute pericarditis    HTN (hypertension), benign    Lumbosacral radiculopathy at L4 (left) 04/03/2021   Lumbar radiculopathy (left) 04/03/2021   Resolved Ambulatory Problems    Diagnosis Date Noted   No Resolved Ambulatory Problems   Past Medical History:  Diagnosis Date   Atypical chest pain    Diastolic dysfunction    Hyperlipemia    Hypertension    Tobacco abuse    Constitutional Exam  General appearance: Well nourished, well developed, and well hydrated. In no apparent acute distress Vitals:   04/03/21 1324  BP: (!) 169/129  Pulse: 86  Resp: 18  Temp: 98.7 F (37.1 C)  TempSrc: Oral  SpO2: 100%  Weight: 134 lb (60.8 kg)  Height: $Remove'5\' 7"'SclmfJj$  (1.702 m)   BMI Assessment: Estimated body mass index is 20.99 kg/m as calculated from the following:   Height as of this encounter: $RemoveBeforeD'5\' 7"'yBslHgmvcaEOWM$  (1.702 m).   Weight as of this encounter: 134 lb (60.8 kg).  BMI interpretation table: BMI level Category Range association with higher incidence of chronic pain  <18 kg/m2 Underweight   18.5-24.9 kg/m2 Ideal body weight   25-29.9 kg/m2 Overweight Increased incidence by 20%  30-34.9 kg/m2 Obese (Class Cannon) Increased incidence by 68%  35-39.9 kg/m2 Severe obesity (Class II) Increased incidence by 136%  >40 kg/m2 Extreme obesity (Class III) Increased incidence by 254%   Patient's current BMI Ideal Body weight  Body mass index is 20.99 kg/m. Ideal body weight: 61.6 kg (135 lb 12.9 oz)   BMI Readings from Last 4 Encounters:  04/03/21 20.99 kg/m  02/23/21 20.05 kg/m  02/17/21 19.89 kg/m  11/02/20 19.89 kg/m   Wt Readings from Last 4 Encounters:   04/03/21 134 lb (60.8 kg)  02/23/21 128 lb (58.1 kg)  02/17/21 126 lb 15.8 oz (57.6 kg)  11/02/20 127 lb (57.6 kg)    Psych/Mental status: Alert, oriented x 3 (person, place, & time)       Eyes: PERLA Respiratory: No evidence of acute respiratory distress  Lumbar Spine Area Exam  Skin & Axial Inspection: No masses, redness, or swelling Alignment: Symmetrical Functional ROM: Pain restricted ROM affecting primarily the left Stability: No instability detected Muscle Tone/Strength: Functionally intact. No obvious neuro-muscular anomalies detected. Sensory (Neurological): Dermatomal pain pattern left l4 + LEFT  SLR Gait & Posture Assessment  Ambulation: Unassisted Gait: Relatively normal for age and body habitus Posture: WNL  Lower Extremity Exam    Side: Right lower extremity  Side: Left lower extremity  Stability: No instability observed          Stability: No instability observed          Skin & Extremity Inspection: Skin color, temperature, and hair growth are WNL. No peripheral edema or cyanosis. No masses, redness, swelling, asymmetry, or associated skin lesions. No contractures.  Skin & Extremity Inspection: Skin color, temperature, and hair growth are WNL. No peripheral edema or cyanosis. No masses, redness, swelling, asymmetry, or associated skin lesions. No contractures.  Functional ROM: Unrestricted ROM                  Functional ROM: Pain restricted ROM for hip joint Limited SLR (straight leg raise)  Muscle Tone/Strength: Functionally intact. No obvious neuro-muscular anomalies detected.  Muscle Tone/Strength: Functionally intact. No obvious neuro-muscular anomalies detected.  Sensory (Neurological): Unimpaired        Sensory (Neurological): Dermatomal pain pattern L4        DTR: Patellar: deferred today Achilles: deferred today Plantar: deferred today  DTR: Patellar: deferred today Achilles: deferred today Plantar: deferred today  Palpation: No palpable anomalies   Palpation: No palpable anomalies    Assessment  Primary Diagnosis & Pertinent Problem List: The primary encounter diagnosis was Lumbosacral radiculopathy at L4 (left). A diagnosis of Lumbar radiculopathy (left) was also pertinent to this visit.  Visit Diagnosis (New problems to examiner): 1. Lumbosacral radiculopathy at L4 (left)   2. Lumbar radiculopathy (left)    Plan of Care     Procedure Orders         Lumbar Transforaminal Epidural     Orders Placed This Encounter  Procedures   Lumbar Transforaminal Epidural    Standing Status:   Future    Standing Expiration Date:   05/03/2021    Scheduling Instructions:     Side: LEFT     Level: L4/5     Sedation: with     Timeframe: ASAP    Order Specific Question:   Where will this procedure be performed?    Answer:   [facility]      Provider-requested follow-up: Return in about 13 days (around 04/16/2021).  Cannon spent a total of 60 minutes reviewing chart data, face-to-face evaluation with the patient, counseling and coordination of care as detailed above.   No future appointments.  Note by: Gillis Santa, MD Date: 04/03/2021; Time: 2:34 PM

## 2021-04-03 NOTE — Patient Instructions (Addendum)
I recommend Ana Cannon have a back brace at work  Recommend lumbar epidural steroid injection   Epidural Steroid Injection Patient Information  Description: The epidural space surrounds the nerves as they exit the spinal cord.  In some patients, the nerves can be compressed and inflamed by a bulging disc or a tight spinal canal (spinal stenosis).  By injecting steroids into the epidural space, we can bring irritated nerves into direct contact with a potentially helpful medication.  These steroids act directly on the irritated nerves and can reduce swelling and inflammation which often leads to decreased pain.  Epidural steroids may be injected anywhere along the spine and from the neck to the low back depending upon the location of your pain.   After numbing the skin with local anesthetic (like Novocaine), a small needle is passed into the epidural space slowly.  You may experience a sensation of pressure while this is being done.  The entire block usually last less than 10 minutes.  Conditions which may be treated by epidural steroids:  Low back and leg pain Neck and arm pain Spinal stenosis Post-laminectomy syndrome Herpes zoster (shingles) pain Pain from compression fractures  Preparation for the injection:  Do not eat any solid food or dairy products within 8 hours of your appointment.  You may drink clear liquids up to 3 hours before appointment.  Clear liquids include water, black coffee, juice or soda.  No milk or cream please. You may take your regular medication, including pain medications, with a sip of water before your appointment  Diabetics should hold regular insulin (if taken separately) and take 1/2 normal NPH dos the morning of the procedure.  Carry some sugar containing items with you to your appointment. A driver must accompany you and be prepared to drive you home after your procedure.  Bring all your current medications with your. An IV may be inserted and sedation may  be given at the discretion of the physician.   A blood pressure cuff, EKG and other monitors will often be applied during the procedure.  Some patients may need to have extra oxygen administered for a short period. You will be asked to provide medical information, including your allergies, prior to the procedure.  We must know immediately if you are taking blood thinners (like Coumadin/Warfarin)  Or if you are allergic to IV iodine contrast (dye). We must know if you could possible be pregnant.  Possible side-effects: Bleeding from needle site Infection (rare, may require surgery) Nerve injury (rare) Numbness & tingling (temporary) Difficulty urinating (rare, temporary) Spinal headache ( a headache worse with upright posture) Light -headedness (temporary) Pain at injection site (several days) Decreased blood pressure (temporary) Weakness in arm/leg (temporary) Pressure sensation in back/neck (temporary)  Call if you experience: Fever/chills associated with headache or increased back/neck pain. Headache worsened by an upright position. New onset weakness or numbness of an extremity below the injection site Hives or difficulty breathing (go to the emergency room) Inflammation or drainage at the infection site Severe back/neck pain Any new symptoms which are concerning to you  Please note:  Although the local anesthetic injected can often make your back or neck feel good for several hours after the injection, the pain will likely return.  It takes 3-7 days for steroids to work in the epidural space.  You may not notice any pain relief for at least that one week.  If effective, we will often do a series of three injections spaced 3-6 weeks  apart to maximally decrease your pain.  After the initial series, we generally will wait several months before considering a repeat injection of the same type.  If you have any questions, please call [phone] [facility]  ______________________________________________________________________  Preparing for Procedure with Sedation  NOTICE: Due to recent regulatory changes, starting on December 16, 2020, procedures requiring intravenous (IV) sedation will no longer be performed at the [facility].  These types of procedures are required to be performed at [facility] ambulatory surgery facility.  We are very sorry for the inconvenience.  Procedure appointments are limited to planned procedures: No Prescription Refills. No disability issues will be discussed. No medication changes will be discussed.  Instructions: Oral Intake: Do not eat or drink anything for at least 8 hours prior to your procedure. (Exception: Blood Pressure Medication. See below.) Transportation: A driver is required. You may not drive yourself after the procedure. Blood Pressure Medicine: Do not forget to take your blood pressure medicine with a sip of water the morning of the procedure. If your Diastolic (lower reading) is above 100 mmHg, elective cases will be cancelled/rescheduled. Blood thinners: These will need to be stopped for procedures. Notify our staff if you are taking any blood thinners. Depending on which one you take, there will be specific instructions on how and when to stop it. Diabetics on insulin: Notify the staff so that you can be scheduled 1st case in the morning. If your diabetes requires high dose insulin, take only  of your normal insulin dose the morning of the procedure and notify the staff that you have done so. Preventing infections: Shower with an antibacterial soap the morning of your procedure. Build-up your immune system: Take 1000 mg of Vitamin C with every meal (3 times a day) the day prior to your procedure. Antibiotics: Inform the staff if you have a condition or reason that requires you to take antibiotics before dental procedures. Pregnancy: If you are pregnant, call and cancel the  procedure. Sickness: If you have a cold, fever, or any active infections, call and cancel the procedure. Arrival: You must be in the facility at least 30 minutes prior to your scheduled procedure. Children: Do not bring children with you. Dress appropriately: Bring dark clothing that you would not mind if they get stained. Valuables: Do not bring any jewelry or valuables.  Reasons to call and reschedule or cancel your procedure: (Following these recommendations will minimize the risk of a serious complication.) Surgeries: Avoid having procedures within 2 weeks of any surgery. (Avoid for 2 weeks before or after any surgery). Flu Shots: Avoid having procedures within 2 weeks of a flu shots. (Avoid for 2 weeks before or after immunizations). Barium: Avoid having a procedure within 7-10 days after having had a radiological study involving the use of radiological contrast. (Myelograms, Barium swallow or enema study). Heart attacks: Avoid any elective procedures or surgeries for the initial 6 months after a "Myocardial Infarction" (Heart Attack). Blood thinners: It is imperative that you stop these medications before procedures. Let us know if you if you take any blood thinner.  Infection: Avoid procedures during or within two weeks of an infection (including chest colds or gastrointestinal problems). Symptoms associated with infections include: Localized redness, fever, chills, night sweats or profuse sweating, burning sensation when voiding, cough, congestion, stuffiness, runny nose, sore throat, diarrhea, nausea, vomiting, cold or Flu symptoms, recent or current infections. It is specially important if the infection is over  the area that we intend to treat. Heart and lung problems: Symptoms that may suggest an active cardiopulmonary problem include: cough, chest pain, breathing difficulties or shortness of breath, dizziness, ankle swelling, uncontrolled high or unusually low blood pressure, and/or  palpitations. If you are experiencing any of these symptoms, cancel your procedure and contact your primary care physician for an evaluation.  Remember:  Regular Business hours are:  Monday to Thursday 8:00 AM to 4:00 PM  Provider's Schedule: Delano Metz, MD:  Procedure days: Tuesday and Thursday 7:30 AM to 4:00 PM  Edward Jolly, MD:  Procedure days: Monday and Wednesday 7:30 AM to 4:00 PM ______________________________________________________________________

## 2021-04-16 ENCOUNTER — Other Ambulatory Visit: Payer: Self-pay

## 2021-04-16 ENCOUNTER — Ambulatory Visit
Admission: RE | Admit: 2021-04-16 | Discharge: 2021-04-16 | Disposition: A | Discharge status: Home / Self Care | Payer: BC Managed Care – PPO | Source: Ambulatory Visit | Attending: Student in an Organized Health Care Education/Training Program | Admitting: Student in an Organized Health Care Education/Training Program

## 2021-04-16 ENCOUNTER — Ambulatory Visit (HOSPITAL_BASED_OUTPATIENT_CLINIC_OR_DEPARTMENT_OTHER): Payer: BC Managed Care – PPO | Admitting: Student in an Organized Health Care Education/Training Program

## 2021-04-16 ENCOUNTER — Encounter: Payer: Self-pay | Admitting: Student in an Organized Health Care Education/Training Program

## 2021-04-16 DIAGNOSIS — M5416 Radiculopathy, lumbar region: Secondary | ICD-10-CM | POA: Insufficient documentation

## 2021-04-16 DIAGNOSIS — M5417 Radiculopathy, lumbosacral region: Secondary | ICD-10-CM

## 2021-04-16 MED ORDER — DEXAMETHASONE SODIUM PHOSPHATE 10 MG/ML IJ SOLN
10.0000 mg | Freq: Once | INTRAMUSCULAR | Status: AC
  Administered 2021-04-16: 10 mg
  Filled 2021-04-16: qty 1

## 2021-04-16 MED ORDER — MIDAZOLAM HCL 5 MG/5ML IJ SOLN
0.5000 mg | Freq: Once | INTRAMUSCULAR | Status: AC
  Administered 2021-04-16: 2 mg via INTRAVENOUS
  Filled 2021-04-16: qty 5

## 2021-04-16 MED ORDER — LIDOCAINE HCL 2 % IJ SOLN
20.0000 mL | Freq: Once | INTRAMUSCULAR | Status: AC
  Administered 2021-04-16: 100 mg
  Filled 2021-04-16: qty 40

## 2021-04-16 MED ORDER — IOHEXOL 180 MG/ML  SOLN
10.0000 mL | Freq: Once | INTRAMUSCULAR | Status: AC
  Administered 2021-04-16: 5 mL via EPIDURAL
  Filled 2021-04-16: qty 20

## 2021-04-16 MED ORDER — SODIUM CHLORIDE 0.9% FLUSH
1.0000 mL | Freq: Once | INTRAVENOUS | Status: AC
  Administered 2021-04-16: 10 mL

## 2021-04-16 MED ORDER — ROPIVACAINE HCL 2 MG/ML IJ SOLN
1.0000 mL | Freq: Once | INTRAMUSCULAR | Status: AC
  Administered 2021-04-16: 20 mL via EPIDURAL

## 2021-04-16 NOTE — Progress Notes (Signed)
Safety precautions to be maintained throughout the outpatient stay will include: orient to surroundings, keep bed in low position, maintain call bell within reach at all times, provide assistance with transfer out of bed and ambulation.  

## 2021-04-16 NOTE — Progress Notes (Signed)
PROVIDER NOTE: Information contained herein reflects review and annotations entered in association with encounter. Interpretation of such information and data should be left to medically-trained personnel. Information provided to patient can be located elsewhere in the medical record under "Patient Instructions". Document created using STT-dictation technology, any transcriptional errors that may result from process are unintentional.    Patient: Ana Cannon  Service Category: Procedure Provider: Edward Jolly, MD DOB: [birth year] DOS: 04/16/2021 Location: [facility] MRN: [id number] Setting: Ambulatory - outpatient Referring Provider: Edward Jolly, MD Type: Established Patient Specialty: Interventional Pain Management PCP: Gale Journey, MD  Primary Reason for Visit: Interventional Pain Management Treatment. CC: Back Pain (lower)    Procedure:          Anesthesia, Analgesia, Anxiolysis:  Type: Trans-Foraminal Epidural Steroid Injection #1  Purpose: Diagnostic/Therapeutic Region: Posterolateral Lumbosacral Target Area: The 6 o'clock position under the pedicle, on the affected side. Approach: Posterior Percutaneous Paravertebral approach. Level: L4-5 Level Laterality: Left-Sided       Anesthesia: Local (1-2% Lidocaine)  Anxiolysis: IV Versed 2 mg bolus Sedation: Minimal  Guidance: Fluoroscopy           Position: Prone   Indications: 1. Lumbosacral radiculopathy at L4 (left)   2. Lumbar radiculopathy (left)    Pain Score: Pre-procedure: 8 /10 Post-procedure: 0-No pain/10     Pre-op H&P Assessment:  Ana Cannon is a 50 y.o. (year old), female patient, seen today for interventional treatment. She  has no past surgical history on file. Ana Cannon has a current medication list which includes the following prescription(s): aluminum-magnesium hydroxide-simethicone, benzonatate, clonazepam, diclofenac, famotidine, gabapentin, hydrocodone-acetaminophen,  methocarbamol, metoclopramide, mirtazapine, naproxen, ondansetron, pantoprazole, prednisone, escitalopram, and metoprolol tartrate. Her primarily concern today is the Back Pain (lower)  Initial Vital Signs:  Pulse/HCG Rate: 80ECG Heart Rate: 76 Temp:  (!) 97.2 F (36.2 C) Resp: 16 BP:  (!) 149/96 SpO2: 100 %  BMI: Estimated body mass index is 20.99 kg/m as calculated from the following:   Height as of this encounter: 5\' 7"  (1.702 m).   Weight as of this encounter: 134 lb (60.8 kg).  Risk Assessment: Allergies: Reviewed. She has No Known Allergies.  Allergy Precautions: None required Coagulopathies: Reviewed. None identified.  Blood-thinner therapy: None at this time Active Infection(s): Reviewed. None identified. Ana Cannon is afebrile  Site Confirmation: Ana Cannon was asked to confirm the procedure and laterality before marking the site Procedure checklist: Completed Consent: Before the procedure and under the influence of no sedative(s), amnesic(s), or anxiolytics, the patient was informed of the treatment options, risks and possible complications. To fulfill our ethical and legal obligations, as recommended by the American Medical Association's Code of Ethics, I have informed the patient of my clinical impression; the nature and purpose of the treatment or procedure; the risks, benefits, and possible complications of the intervention; the alternatives, including doing nothing; the risk(s) and benefit(s) of the alternative treatment(s) or procedure(s); and the risk(s) and benefit(s) of doing nothing. The patient was provided information about the general risks and possible complications associated with the procedure. These may include, but are not limited to: failure to achieve desired goals, infection, bleeding, organ or nerve damage, allergic reactions, paralysis, and death. In addition, the patient was informed of those risks and complications associated to Spine-related  procedures, such as failure to decrease pain; infection (i.e.: Meningitis, epidural or intraspinal abscess); bleeding (i.e.: epidural hematoma, subarachnoid hemorrhage, or any other type of intraspinal or peri-dural bleeding); organ or nerve damage (  i.e.: Any type of peripheral nerve, nerve root, or spinal cord injury) with subsequent damage to sensory, motor, and/or autonomic systems, resulting in permanent pain, numbness, and/or weakness of one or several areas of the body; allergic reactions; (i.e.: anaphylactic reaction); and/or death. Furthermore, the patient was informed of those risks and complications associated with the medications. These include, but are not limited to: allergic reactions (i.e.: anaphylactic or anaphylactoid reaction(s)); adrenal axis suppression; blood sugar elevation that in diabetics may result in ketoacidosis or comma; water retention that in patients with history of congestive heart failure may result in shortness of breath, pulmonary edema, and decompensation with resultant heart failure; weight gain; swelling or edema; medication-induced neural toxicity; particulate matter embolism and blood vessel occlusion with resultant organ, and/or nervous system infarction; and/or aseptic necrosis of one or more joints. Finally, the patient was informed that Medicine is not an exact science; therefore, there is also the possibility of unforeseen or unpredictable risks and/or possible complications that may result in a catastrophic outcome. The patient indicated having understood very clearly. We have given the patient no guarantees and we have made no promises. Enough time was given to the patient to ask questions, all of which were answered to the patient's satisfaction. Ana Cannon has indicated that she wanted to continue with the procedure. Attestation: I, the ordering provider, attest that I have discussed with the patient the benefits, risks, side-effects, alternatives, likelihood of  achieving goals, and potential problems during recovery for the procedure that I have provided informed consent. Date  Time: 04/16/2021  8:49 AM  Pre-Procedure Preparation:  Monitoring: As per clinic protocol. Respiration, ETCO2, SpO2, BP, heart rate and rhythm monitor placed and checked for adequate function Safety Precautions: Patient was assessed for positional comfort and pressure points before starting the procedure. Time-out: I initiated and conducted the "Time-out" before starting the procedure, as per protocol. The patient was asked to participate by confirming the accuracy of the "Time Out" information. Verification of the correct person, site, and procedure were performed and confirmed by me, the nursing staff, and the patient. "Time-out" conducted as per Joint Commission's Universal Protocol (UP.01.01.01). Time: 0935  Description of Procedure:          Area Prepped: Entire Posterior Lumbosacral Area DuraPrep (Iodine Povacrylex [0.7% available iodine] and Isopropyl Alcohol, 74% w/w) Safety Precautions: Aspiration looking for blood return was conducted prior to all injections. At no point did we inject any substances, as a needle was being advanced. No attempts were made at seeking any paresthesias. Safe injection practices and needle disposal techniques used. Medications properly checked for expiration dates. SDV (single dose vial) medications used. Description of the Procedure: Protocol guidelines were followed. The patient was placed in position over the procedure table. The target area was identified and the area prepped in the usual manner. Skin & deeper tissues infiltrated with local anesthetic. Appropriate amount of time allowed to pass for local anesthetics to take effect. The procedure needles were then advanced to the target area. Proper needle placement secured. Negative aspiration confirmed. Solution injected in intermittent fashion, asking for systemic symptoms every 0.5cc of  injectate. The needles were then removed and the area cleansed, making sure to leave some of the prepping solution back to take advantage of its long term bactericidal properties.  Vitals:   04/16/21 0859 04/16/21 0940 04/16/21 0951  BP: (!) 149/96 (!) 153/100 (!) 169/108  Pulse: 80  82  Resp: Temp: (!) 97.2 F (36.2 C)  TempSrc: Temporal    SpO2: 100% 100% 100%  Weight: 134 lb (60.8 kg)    Height: 5\' 7"  (1.702 m)      Start Time: 0935 hrs. End Time: 0943 hrs.  Materials:  Needle(s) Type: Spinal Needle Gauge: 22G Length: 3.5-in Medication(s): Please see orders for medications and dosing details.  3 cc solution made of 1 cc of preservative-free saline, 1 cc of 0.2% ropivacaine, 1 cc of Decadron 10 mg/cc.   Imaging Guidance (Spinal):          Type of Imaging Technique: Fluoroscopy Guidance (Spinal) Indication(s): Assistance in needle guidance and placement for procedures requiring needle placement in or near specific anatomical locations not easily accessible without such assistance. Exposure Time: Please see nurses notes. Contrast: Before injecting any contrast, we confirmed that the patient did not have an allergy to iodine, shellfish, or radiological contrast. Once satisfactory needle placement was completed at the desired level, radiological contrast was injected. Contrast injected under live fluoroscopy. No contrast complications. See chart for type and volume of contrast used. Fluoroscopic Guidance: I was personally present during the use of fluoroscopy. "Tunnel Vision Technique" used to obtain the best possible view of the target area. Parallax error corrected before commencing the procedure. "Direction-depth-direction" technique used to introduce the needle under continuous pulsed fluoroscopy. Once target was reached, antero-posterior, oblique, and lateral fluoroscopic projection used confirm needle placement in all planes. Images permanently stored in  EMR. Interpretation: I personally interpreted the imaging intraoperatively. Adequate needle placement confirmed in multiple planes. Appropriate spread of contrast into desired area was observed. No evidence of afferent or efferent intravascular uptake. No intrathecal or subarachnoid spread observed. Permanent images saved into the patient's record.   Post-operative Assessment:  Post-procedure Vital Signs:  Pulse/HCG Rate: 8276 Temp:  (!) 97.2 F (36.2 C) Resp: 14 BP:  (!) 169/108 SpO2: 100 %  EBL: None  Complications: No immediate post-treatment complications observed by team, or reported by patient.  Note: The patient tolerated the entire procedure well. A repeat set of vitals were taken after the procedure and the patient was kept under observation following institutional policy, for this type of procedure. Post-procedural neurological assessment was performed, showing return to baseline, prior to discharge. The patient was provided with post-procedure discharge instructions, including a section on how to identify potential problems. Should any problems arise concerning this procedure, the patient was given instructions to immediately contact , at any time, without hesitation. In any case, we plan to contact the patient by telephone for a follow-up status report regarding this interventional procedure.  Comments:  No additional relevant information.  Plan of Care  Orders:  Orders Placed This Encounter  Procedures   DG PAIN CLINIC C-ARM 1-60 MIN NO REPORT    Intraoperative interpretation by procedural physician at [facility].    Standing Status:   Standing    Number of Occurrences:   1    Order Specific Question:   Reason for exam:    Answer:   Assistance in needle guidance and placement for procedures requiring needle placement in or near specific anatomical locations not easily accessible without such assistance.    Medications ordered for procedure: Meds ordered  this encounter  Medications   iohexol (OMNIPAQUE) 180 MG/ML injection 10 mL    Must be Myelogram-compatible. If not available, you may substitute with a water-soluble, non-ionic, hypoallergenic, myelogram-compatible radiological contrast medium.   lidocaine (XYLOCAINE) 2 % (with pres) injection 400 mg   sodium chloride flush (NS) 0.9 %  injection 1 mL   ropivacaine (PF) 2 mg/mL (0.2%) (NAROPIN) injection 1 mL   dexamethasone (DECADRON) injection 10 mg   midazolam (VERSED) 5 MG/5ML injection 0.5-2 mg    Make sure Flumazenil is available in the pyxis when using this medication. If oversedation occurs, administer 0.2 mg IV over 15 sec. If after 45 sec no response, administer 0.2 mg again over 1 min; may repeat at 1 min intervals; not to exceed 4 doses (1 mg)   Medications administered: We administered iohexol, lidocaine, sodium chloride flush, ropivacaine (PF) 2 mg/mL (0.2%), dexamethasone, and midazolam.  See the medical record for exact dosing, route, and time of administration.  Follow-up plan:   Return in about 4 weeks (around 05/14/2021) for virtual, Post Procedure Evaluation.     Recent Visits Date Type Provider Dept  04/03/21 Office Visit Edward Jolly, MD [facility]  Showing recent visits within past 90 days and meeting all other requirements Today's Visits Date Type Provider Dept  04/16/21 Procedure visit Edward Jolly, MD [facility]  Showing today's visits and meeting all other requirements Future Appointments Date Type Provider Dept  05/20/21 Appointment Edward Jolly, MD [facility]  Showing future appointments within next 90 days and meeting all other requirements Disposition: Discharge home  Discharge (Date  Time): 04/16/2021; 1000 hrs.   Primary Care Physician: Gale Journey, MD Location: [facility] Note by: Edward Jolly, MD Date: 04/16/2021; Time: 1:47 PM  Disclaimer:  Medicine is not an exact  science. The only guarantee in medicine is that nothing is guaranteed. It is important to note that the decision to proceed with this intervention was based on the information collected from the patient. The Data and conclusions were drawn from the patient's questionnaire, the interview, and the physical examination. Because the information was provided in large part by the patient, it cannot be guaranteed that it has not been purposely or unconsciously manipulated. Every effort has been made to obtain as much relevant data as possible for this evaluation. It is important to note that the conclusions that lead to this procedure are derived in large part from the available data. Always take into account that the treatment will also be dependent on availability of resources and existing treatment guidelines, considered by other Pain Management Practitioners as being common knowledge and practice, at the time of the intervention. For Medico-Legal purposes, it is also important to point out that variation in procedural techniques and pharmacological choices are the acceptable norm. The indications, contraindications, technique, and results of the above procedure should only be interpreted and judged by a Board-Certified Interventional Pain Specialist with extensive familiarity and expertise in the same exact procedure and technique.

## 2021-04-16 NOTE — Patient Instructions (Signed)

## 2021-04-17 ENCOUNTER — Telehealth: Payer: Self-pay | Admitting: *Deleted

## 2021-04-17 NOTE — Telephone Encounter (Signed)
No problems post procedure. 

## 2021-05-15 ENCOUNTER — Telehealth: Payer: Self-pay

## 2021-05-15 NOTE — Telephone Encounter (Signed)
LM for patient to call office for pere virtual appointment questions.  °

## 2021-05-20 ENCOUNTER — Ambulatory Visit
Payer: BC Managed Care – PPO | Attending: Student in an Organized Health Care Education/Training Program | Admitting: Student in an Organized Health Care Education/Training Program

## 2021-05-20 ENCOUNTER — Encounter: Payer: Self-pay | Admitting: Student in an Organized Health Care Education/Training Program

## 2021-05-20 ENCOUNTER — Other Ambulatory Visit: Payer: Self-pay

## 2021-05-20 DIAGNOSIS — M5417 Radiculopathy, lumbosacral region: Secondary | ICD-10-CM | POA: Diagnosis not present

## 2021-05-20 DIAGNOSIS — M5416 Radiculopathy, lumbar region: Secondary | ICD-10-CM | POA: Diagnosis not present

## 2021-05-20 NOTE — Patient Instructions (Signed)
______________________________________________________________________  Preparing for Procedure with Sedation  NOTICE: Due to recent regulatory changes, starting on December 16, 2020, procedures requiring intravenous (IV) sedation will no longer be performed at the [facility].  These types of procedures are required to be performed at [facility] ambulatory surgery facility.  We are very sorry for the inconvenience.  Procedure appointments are limited to planned procedures: No Prescription Refills. No disability issues will be discussed. No medication changes will be discussed.  Instructions: Oral Intake: Do not eat or drink anything for at least 8 hours prior to your procedure. (Exception: Blood Pressure Medication. See below.) Transportation: A driver is required. You may not drive yourself after the procedure. Blood Pressure Medicine: Do not forget to take your blood pressure medicine with a sip of water the morning of the procedure. If your Diastolic (lower reading) is above 100 mmHg, elective cases will be cancelled/rescheduled. Blood thinners: These will need to be stopped for procedures. Notify our staff if you are taking any blood thinners. Depending on which one you take, there will be specific instructions on how and when to stop it. Diabetics on insulin: Notify the staff so that you can be scheduled 1st case in the morning. If your diabetes requires high dose insulin, take only  of your normal insulin dose the morning of the procedure and notify the staff that you have done so. Preventing infections: Shower with an antibacterial soap the morning of your procedure. Build-up your immune system: Take 1000 mg of Vitamin C with every meal (3 times a day) the day prior to your procedure. Antibiotics: Inform the staff if you have a condition or reason that requires you to take antibiotics before dental procedures. Pregnancy: If you are pregnant, call and cancel the procedure. Sickness: If  you have a cold, fever, or any active infections, call and cancel the procedure. Arrival: You must be in the facility at least 30 minutes prior to your scheduled procedure. Children: Do not bring children with you. Dress appropriately: Bring dark clothing that you would not mind if they get stained. Valuables: Do not bring any jewelry or valuables.  Reasons to call and reschedule or cancel your procedure: (Following these recommendations will minimize the risk of a serious complication.) Surgeries: Avoid having procedures within 2 weeks of any surgery. (Avoid for 2 weeks before or after any surgery). Flu Shots: Avoid having procedures within 2 weeks of a flu shots. (Avoid for 2 weeks before or after immunizations). Barium: Avoid having a procedure within 7-10 days after having had a radiological study involving the use of radiological contrast. (Myelograms, Barium swallow or enema study). Heart attacks: Avoid any elective procedures or surgeries for the initial 6 months after a "Myocardial Infarction" (Heart Attack). Blood thinners: It is imperative that you stop these medications before procedures. Let us know if you if you take any blood thinner.  Infection: Avoid procedures during or within two weeks of an infection (including chest colds or gastrointestinal problems). Symptoms associated with infections include: Localized redness, fever, chills, night sweats or profuse sweating, burning sensation when voiding, cough, congestion, stuffiness, runny nose, sore throat, diarrhea, nausea, vomiting, cold or Flu symptoms, recent or current infections. It is specially important if the infection is over the area that we intend to treat. Heart and lung problems: Symptoms that may suggest an active cardiopulmonary problem include: cough, chest pain, breathing difficulties or shortness of breath, dizziness, ankle swelling, uncontrolled high or unusually low blood pressure, and/or palpitations. If you are    experiencing any of these symptoms, cancel your procedure and contact your primary care physician for an evaluation.  Remember:  Regular Business hours are:  Monday to Thursday 8:00 AM to 4:00 PM  Provider's Schedule: Francisco Naveira, MD:  Procedure days: Tuesday and Thursday 7:30 AM to 4:00 PM  Bilal Lateef, MD:  Procedure days: Monday and Wednesday 7:30 AM to 4:00 PM ______________________________________________________________________   

## 2021-05-20 NOTE — Progress Notes (Signed)
Patient: Ana Cannon  Service Category: E/M  Provider: Gillis Santa, MD  DOB: 02/15/1971  DOS: 05/20/2021  Location: Office  MRN: [id number]  Setting: Ambulatory outpatient  Referring Provider: Aldean Jewett, MD  Type: Established Patient  Specialty: Interventional Pain Management  PCP: Aldean Jewett, MD  Location: Remote location  Delivery: TeleHealth     Virtual Encounter - Pain Management PROVIDER NOTE: Information contained herein reflects review and annotations entered in association with encounter. Interpretation of such information and data should be left to medically-trained personnel. Information provided to patient can be located elsewhere in the medical record under "Patient Instructions". Document created using STT-dictation technology, any transcriptional errors that may result from process are unintentional.    Contact & Pharmacy Preferred: [phone] Home: [phone] (home) Mobile: [phone] (mobile) E-mail: [email or web address]  [facility] [phone] - [address] [place]. AT [place]. [place] [address] Phone: [phone] Fax: [phone]  [id number] - [place], [place] - [place] AT [address] [place] [address] Phone: [phone] Fax: [place] [phone] [place], [place] - [place] [address] [place] [address] [place] [address] Phone: [phone] Fax: [phone]   Pre-screening  Ana Cannon offered "in-person" vs "virtual" encounter. She indicated preferring virtual for this encounter.   Reason COVID-19*   Social distancing based on CDC and AMA recommendations.   I contacted Ana Cannon on 05/20/2021 via telephone.      I clearly identified myself as Gillis Santa, MD. I verified that I was speaking with  the correct person using two identifiers (Name: Ana Cannon, and date of birth: 27-Sep-1970).  Consent I sought verbal advanced consent from Ana Cannon for virtual visit interactions. I informed Ana Cannon of possible security and privacy concerns, risks, and limitations associated with providing "not-in-person" medical evaluation and management services. I also informed Ana Cannon of the availability of "in-person" appointments. Finally, I informed her that there would be a charge for the virtual visit and that she could be  personally, fully or partially, financially responsible for it. Ana Cannon expressed understanding and agreed to proceed.   Historic Elements   Ana Cannon is a 51 y.o. year old, female patient evaluated today after our last contact on 04/16/2021. Ana Cannon  has a past medical history of Atypical chest pain, Diastolic dysfunction, Hyperlipemia, Hypertension, and Tobacco abuse. She also  has no past surgical history on file. Ana Cannon has a current medication list which includes the following prescription(s): aluminum-magnesium hydroxide-simethicone, clonazepam, diclofenac, escitalopram, famotidine, gabapentin, mirtazapine, sertraline, benzonatate, hydrocodone-acetaminophen, methocarbamol, metoclopramide, metoprolol tartrate, mirtazapine, naproxen, ondansetron, pantoprazole, and prednisone. She  reports that she has been smoking cigarettes. She has been smoking an average of .5 packs per day. She has never used smokeless tobacco. She reports that she does not currently use alcohol. She reports that she does not use drugs. Ana Cannon has No Known Allergies.   HPI  Today, she is being contacted for a post-procedure assessment.   Post-procedure evaluation     Procedure:          Anesthesia, Analgesia, Anxiolysis:  Type: Trans-Foraminal Epidural Steroid Injection #1  Purpose: Diagnostic/Therapeutic Region: Posterolateral Lumbosacral Target Area: The 6  o'clock position under the pedicle, on  the affected side. Approach: Posterior Percutaneous Paravertebral approach. Level: L4-5 Level Laterality: Left-Sided       Anesthesia: Local (1-2% Lidocaine)  Anxiolysis: IV Versed 2 mg bolus Sedation: Minimal  Guidance: Fluoroscopy           Position: Prone   Indications: 1. Lumbosacral radiculopathy at L4 (left)   2. Lumbar radiculopathy (left)    Pain Score: Pre-procedure: 8 /10 Post-procedure: 0-No pain/10      Effectiveness:  Initial hour after procedure: 100 %  Subsequent 4-6 hours post-procedure: 100 %  Analgesia past initial 6 hours: 100 % (leg pain resolved.)  Ongoing improvement:  Analgesic:  90-100% Function: Ana Cannon reports improvement in function ROM: Ana Cannon reports improvement in ROM   Laboratory Chemistry Profile   Renal Lab Results  Component Value Date   BUN 11 05/22/2019   CREATININE 0.71 05/22/2019   GFRAA >60 05/22/2019   GFRNONAA >60 05/22/2019    Hepatic Lab Results  Component Value Date   AST 15 01/11/2018   ALT 11 01/11/2018   ALBUMIN 3.9 01/11/2018   ALKPHOS 53 01/11/2018   LIPASE 25 01/11/2018    Electrolytes Lab Results  Component Value Date   NA 138 05/22/2019   K 4.1 05/22/2019   CL 105 05/22/2019   CALCIUM 8.8 (L) 05/22/2019    Bone No results found for: VD25OH, VD125OH2TOT, FS1423TR3, UY2334DH6, 25OHVITD1, 25OHVITD2, 25OHVITD3, TESTOFREE, TESTOSTERONE  Inflammation (CRP: Acute Phase) (ESR: Chronic Phase) No results found for: CRP, ESRSEDRATE, LATICACIDVEN       Note: Above Lab results reviewed.   Assessment  The primary encounter diagnosis was Lumbosacral radiculopathy at L4 (left). A diagnosis of Lumbar radiculopathy (left) was also pertinent to this visit.  Plan of Care   Significant pain relief and improvement in functional status after left L4-L5 transforaminal ESI.  Endorses 100% pain relief of left leg.  Patient is back to work and pleased with results.  Follow-up  as needed.  Orders:  Orders Placed This Encounter  Procedures   Lumbar Transforaminal Epidural    Standing Status:   Standing    Number of Occurrences:   2    Standing Expiration Date:   11/17/2021    Scheduling Instructions:     LEFT L4/5 PRN     Sedation: Patient's choice.     PRN    Order Specific Question:   Where will this procedure be performed?    Answer:   [facility]   Follow-up plan:   Return if symptoms worsen or fail to improve.    Recent Visits Date Type Provider Dept  04/16/21 Procedure visit Gillis Santa, MD [facility]  04/03/21 Office Visit Gillis Santa, MD [facility]  Showing recent visits within past 90 days and meeting all other requirements Today's Visits Date Type Provider Dept  05/20/21 Office Visit Gillis Santa, MD [facility]  Showing today's visits and meeting all other requirements Future Appointments No visits were found meeting these conditions. Showing future appointments within next 90 days and meeting all other requirements  I discussed the assessment and treatment plan with the patient. The patient was provided an opportunity to ask questions and all were answered. The patient agreed with the plan and demonstrated an understanding of the instructions.  Patient advised to call back or seek an in-person evaluation if the symptoms or condition worsens.  Duration of encounter: 81mnutes.  Note by: BGillis Santa MD Date: 05/20/2021; Time: 10:38 AM

## 2021-08-16 ENCOUNTER — Emergency Department
Admission: EM | Admit: 2021-08-16 | Discharge: 2021-08-16 | Disposition: A | Discharge status: Home / Self Care | Payer: BC Managed Care – PPO | Attending: Emergency Medicine | Admitting: Emergency Medicine

## 2021-08-16 ENCOUNTER — Other Ambulatory Visit: Payer: Self-pay

## 2021-08-16 DIAGNOSIS — R42 Dizziness and giddiness: Secondary | ICD-10-CM | POA: Diagnosis present

## 2021-08-16 LAB — CBC
HCT: 34.8 % — ABNORMAL LOW (ref 36.0–46.0)
Hemoglobin: 11.8 g/dL — ABNORMAL LOW (ref 12.0–15.0)
MCH: 29.5 pg (ref 26.0–34.0)
MCHC: 33.9 g/dL (ref 30.0–36.0)
MCV: 87 fL (ref 80.0–100.0)
Platelets: 316 10*3/uL (ref 150–400)
RBC: 4 MIL/uL (ref 3.87–5.11)
RDW: 13.7 % (ref 11.5–15.5)
WBC: 7.9 10*3/uL (ref 4.0–10.5)
nRBC: 0 % (ref 0.0–0.2)

## 2021-08-16 LAB — COMPREHENSIVE METABOLIC PANEL
ALT: 12 U/L (ref 0–44)
AST: 14 U/L — ABNORMAL LOW (ref 15–41)
Albumin: 3.7 g/dL (ref 3.5–5.0)
Alkaline Phosphatase: 54 U/L (ref 38–126)
Anion gap: 4 — ABNORMAL LOW (ref 5–15)
BUN: 7 mg/dL (ref 6–20)
CO2: 26 mmol/L (ref 22–32)
Calcium: 8.7 mg/dL — ABNORMAL LOW (ref 8.9–10.3)
Chloride: 108 mmol/L (ref 98–111)
Creatinine, Ser: 0.69 mg/dL (ref 0.44–1.00)
GFR, Estimated: >60 mL/min (ref >60)
Glucose, Bld: 93 mg/dL (ref 70–99)
Potassium: 3.8 mmol/L (ref 3.5–5.1)
Sodium: 138 mmol/L (ref 135–145)
Total Bilirubin: 0.7 mg/dL (ref 0.3–1.2)
Total Protein: 7.2 g/dL (ref 6.5–8.1)

## 2021-08-16 MED ORDER — MECLIZINE HCL 25 MG PO TABS: 25.0000 mg | ORAL_TABLET | Freq: Three times a day (TID) | ORAL | 0 refills | Status: DC | PRN

## 2021-08-16 MED ORDER — MECLIZINE HCL 25 MG PO TABS
25.0000 mg | ORAL_TABLET | Freq: Once | ORAL | Status: AC
  Administered 2021-08-16: 25 mg via ORAL
  Filled 2021-08-16: qty 1

## 2021-08-16 NOTE — ED Provider Notes (Signed)
? ?[facility] ?Provider Note ? ? ? Event Date/Time  ? First MD Initiated Contact with Patient 08/16/21 1703   ?  (approximate) ? ? ?History  ? ?Dizziness ? ? ?HPI ? ?Ana Cannon is a 51 y.o. female  who, per PCP office note dated 06/05/2021 has history of depression, sciatica, who presents to the emergency department today because of concern for feeling like she was going to pass out.  The patient says that it happened when she was at work today.  She had eaten before going to work.  She denies similar symptoms in the past.  She does not have any associated chest pain or palpitations.  She continues to feel dizzy during my exam.  Patient denies any recent fevers or illness.  States she was told that she has prediabetes at 1 time and is concerned that her sugars might be causing her symptoms. ? ?Physical Exam  ? ?Triage Vital Signs: ?ED Triage Vitals  ?Enc Vitals Group  ?   BP 08/16/21 1648 (!) 159/104  ?   Pulse Rate 08/16/21 1648 95  ?   Resp 08/16/21 1648 18  ?   Temp 08/16/21 1650 99 ?F (37.2 ?C)  ?   Temp Source 08/16/21 1650 Oral  ?   SpO2 08/16/21 1648 99 %  ?   Weight 08/16/21 1646 144 lb (65.3 kg)  ?   Height 08/16/21 1646 5\' 7"  (1.702 m)  ?   Head Circumference --   ?   Peak Flow --   ?   Pain Score 08/16/21 1646 0  ?   Pain Loc --   ?   Pain Edu? --   ?   Excl. in GC? --   ? ? ?Most recent vital signs: ?Vitals:  ? 08/16/21 1648 08/16/21 1650  ?BP: (!) 159/104   ?Pulse: 95   ?Resp: 18   ?Temp:  99 ?F (37.2 ?C)  ?SpO2: 99%   ? ? ?General: Awake, no distress.  ?CV:  Good peripheral perfusion. Regular rate and rhythm. No m/r/g ?Resp:  Normal effort. Clear to auscultation bilaterally ?Abd:  No distention. Non tender.  ? ? ? ?ED Results / Procedures / Treatments  ? ?Labs ?(all labs ordered are listed, but only abnormal results are displayed) ?Labs Reviewed  ?CBC - Abnormal; Notable for the following components:  ?    Result Value  ? Hemoglobin 11.8 (*)   ? HCT 34.8 (*)   ? All other  components within normal limits  ?COMPREHENSIVE METABOLIC PANEL - Abnormal; Notable for the following components:  ? Calcium 8.7 (*)   ? AST 14 (*)   ? Anion gap 4 (*)   ? All other components within normal limits  ?URINALYSIS, ROUTINE W REFLEX MICROSCOPIC  ? ? ? ?EKG ? ?I06/01/23, attending physician, personally viewed and interpreted this EKG ? ?EKG Time: 1653 ?Rate: 84 ?Rhythm: sinus rhythm ?Axis: normal ?Intervals: qtc 454 ?QRS: narrow, q waves v1 ?ST changes: no st elevation ?Impression: abnormal ekg ? ? ?RADIOLOGY ?None ? ? ?PROCEDURES: ? ?Critical Care performed: No ? ?Procedures ? ? ?MEDICATIONS ORDERED IN ED: ?Medications - No data to display ? ? ?IMPRESSION / MDM / ASSESSMENT AND PLAN / ED COURSE  ?I reviewed the triage vital signs and the nursing notes. ?             ?               ? ?Differential  diagnosis includes, but is not limited to, anemia, electrolyte abnormality, arrhythmia, vertigo. ? ?Patient presented to the emergency department today because of concerns for near syncopal episode.  EKG without any concerning arrhythmia.  Blood work does not show any significant anemia.  No signs of significant dehydration on chemistry or other concerning electrolyte abnormality.  Patient states she did feel somewhat better with meclizine.  This time I do wonder if patient is suffering from vertigo type symptoms.  Discussed this with the patient.  Will plan on discharging with prescription for meclizine.  Encourage patient to follow-up with primary care. ? ? ? ?FINAL CLINICAL IMPRESSION(S) / ED DIAGNOSES  ? ?Final diagnoses:  ?Dizziness  ? ? ? ?Note:  This document was prepared using Dragon voice recognition software and may include unintentional dictation errors. ? ?  ?Phineas Semen, MD ?08/16/21 2127 ? ?

## 2021-08-16 NOTE — ED Triage Notes (Signed)
Pt states she has been feeling faint, like she was going to pass out- pt states this just started today- pt denies headache- pt states she "just doesn't feel right" ?

## 2021-08-16 NOTE — ED Notes (Signed)
Lavender, light green, blue, red top tubes sent to lab. ?

## 2021-08-16 NOTE — Discharge Instructions (Addendum)
Please seek medical attention for any high fevers, chest pain, shortness of breath, change in behavior, persistent vomiting, bloody stool or any other new or concerning symptoms.  

## 2021-10-17 ENCOUNTER — Emergency Department
Admission: EM | Admit: 2021-10-17 | Discharge: 2021-10-17 | Disposition: A | Discharge status: Home / Self Care | Payer: BC Managed Care – PPO | Attending: Emergency Medicine | Admitting: Emergency Medicine

## 2021-10-17 ENCOUNTER — Other Ambulatory Visit: Payer: Self-pay

## 2021-10-17 DIAGNOSIS — I10 Essential (primary) hypertension: Secondary | ICD-10-CM | POA: Diagnosis not present

## 2021-10-17 DIAGNOSIS — L210 Seborrhea capitis: Secondary | ICD-10-CM | POA: Insufficient documentation

## 2021-10-17 MED ORDER — FLUOCINOLONE ACETONIDE SCALP 0.01 % EX OIL: TOPICAL_OIL | CUTANEOUS | 0 refills | Status: DC

## 2021-10-17 NOTE — ED Notes (Signed)
See triage note. Pt has head wrap on. Pt denies itchiness; states "it feels like slime runs down my scalp sometimes". Pt denies pain at scalp.

## 2021-10-17 NOTE — ED Notes (Signed)
Pt attempted to sign for d/c paperwork and education but topaz frozen.  

## 2021-10-17 NOTE — ED Triage Notes (Signed)
Pt to ED via POV from home. Pt reports scalp irritation for 2 weeks. Pt state it feels like her scalp is draining and running down her face. Pt made an appointment with internal medicine who prescribed a medicated shampoo and has been using it for 2 weeks with no improvement.

## 2021-10-17 NOTE — Discharge Instructions (Addendum)
Wet or dampen hair and scalp thorough. Apply a thin film of fluocinolone acetonide topical oil, 0.01% on the scalp, massage well and cover scalp with the supplied shower cap. Leave on overnight or for a minimum of 4 hours before washing off. Wash hair with regular shampoo and rinse thoroughly.  -Follow-up with the dermatology office listed above and schedule appointment if your symptoms persist  -Return to the emergency department anytime if you begin to experience any new or worsening symptoms.

## 2021-10-17 NOTE — ED Provider Notes (Signed)
[facility] Provider Note    Event Date/Time   First MD Initiated Contact with Patient 10/17/21 1719     (approximate)   History   Chief Complaint No chief complaint on file.   HPI Ana Cannon is a 51 y.o. female, history of GAD, hypertension, hyperlipidemia, tobacco use, presents to the emergency department for evaluation of scalp irritation.  Patient states that she has been experiencing excessive " mucus" draining down her head and into her face.  Reports mild irritation on the scalp as well.  She was recently diagnosed with seborrheic capitis and prescribed a ketoconazole shampoo.  She has been taking it for the past 2 weeks with no improvement.  She was also prescribed a fluocinolone shampoo, however she did not pick it up because it was reportedly too expensive.  Denies fever/chills, chest pain, shortness of breath, abdominal pain, nausea/vomiting, vision change, hearing changes, or headache.  Denies any new shampoo/washes.  No known environmental exposures.  History Limitations: No limitations.        Physical Exam  Triage Vital Signs: ED Triage Vitals  Enc Vitals Group     BP 10/17/21 1631 (!) 145/106     Pulse Rate 10/17/21 1631 82     Resp 10/17/21 1631 18     Temp 10/17/21 1631 98.1 F (36.7 C)     Temp Source 10/17/21 1631 Oral     SpO2 10/17/21 1631 100 %     Weight --      Height 10/17/21 1628 5\' 7"  (1.702 m)     Head Circumference --      Peak Flow --      Pain Score 10/17/21 1628 0     Pain Loc --      Pain Edu? --      Excl. in GC? --     Most recent vital signs: Vitals:   10/17/21 1825 10/17/21 1827  BP: (!) 149/97   Pulse: 74   Resp:  17  Temp:    SpO2: 100%     General: Awake, NAD.  Skin: Warm, dry. No rashes or lesions.  Eyes: PERRL. Conjunctivae normal.  CV: Good peripheral perfusion.  Resp: Normal effort.  Abd: Soft, non-tender. No distention.  Neuro: At baseline. No gross neurological deficits.    Focused Exam: No gross deformities to the scalp.  The scalp in the frontal aspect of her head does appear potentially slightly more oily, however I do not see any obvious mucous or drainage coming from the scalp.  Mild erythema, though difficult to appreciate due to patient's darker skin complexion.  No significant lesions.  No warmth or tenderness.  No mites or insects.  No dandruff noted.  Physical Exam    ED Results / Procedures / Treatments  Labs (all labs ordered are listed, but only abnormal results are displayed) Labs Reviewed - No data to display   EKG N/A.   RADIOLOGY  ED Provider Interpretation: N/A.  No results found.  PROCEDURES:  Critical Care performed: N/A.  Procedures    MEDICATIONS ORDERED IN ED: Medications - No data to display   IMPRESSION / MDM / ASSESSMENT AND PLAN / ED COURSE  I reviewed the triage vital signs and the nursing notes.                              Differential diagnosis includes, but is not limited to, seborrheic dermatitis/capitis, tinea  capitis, contact dermatitis, allergic dermatitis.  ED Course Patient appears well, NAD.  Assessment/Plan Presentation consistent with possible seborrheic capitis, though physical exam is overall unimpressive.  I do not see the significant drainage that the patient is endorsing, although she does appear to possibly have some excessive oil along her hairline.  No obvious pustules, crusting, vesicles, or flaking.  She does not endorse any significant pruritus or pain from it.  We will trial a fluocinolone oil that is hopefully a lower cost than the fluocinolone shampoo.  Otherwise, I provided the patient a referral to dermatology for further evaluation and management.  We will plan to discharge  Patient's presentation is most consistent with acute, uncomplicated illness.   Provided the patient with anticipatory guidance, return precautions, and educational material. Encouraged the patient to  return to the emergency department at any time if they begin to experience any new or worsening symptoms. Patient expressed understanding and agreed with the plan.       FINAL CLINICAL IMPRESSION(S) / ED DIAGNOSES   Final diagnoses:  Seborrhea capitis     Rx / DC Orders   ED Discharge Orders          Ordered    Fluocinolone Acetonide Scalp 0.01 % OIL        10/17/21 1800             Note:  This document was prepared using Dragon voice recognition software and may include unintentional dictation errors.   Varney Daily, [place] 10/18/21 Atlee Abide    Delton [place], MD 10/26/21 [phone]

## 2022-02-05 ENCOUNTER — Emergency Department: Payer: BC Managed Care – PPO

## 2022-02-05 ENCOUNTER — Encounter: Payer: Self-pay | Admitting: Emergency Medicine

## 2022-02-05 ENCOUNTER — Other Ambulatory Visit: Payer: Self-pay

## 2022-02-05 ENCOUNTER — Emergency Department
Admission: EM | Admit: 2022-02-05 | Discharge: 2022-02-05 | Disposition: A | Discharge status: Home / Self Care | Payer: BC Managed Care – PPO | Attending: Emergency Medicine | Admitting: Emergency Medicine

## 2022-02-05 DIAGNOSIS — R531 Weakness: Secondary | ICD-10-CM | POA: Diagnosis present

## 2022-02-05 DIAGNOSIS — N3000 Acute cystitis without hematuria: Secondary | ICD-10-CM | POA: Insufficient documentation

## 2022-02-05 DIAGNOSIS — Z20822 Contact with and (suspected) exposure to covid-19: Secondary | ICD-10-CM | POA: Insufficient documentation

## 2022-02-05 DIAGNOSIS — R0602 Shortness of breath: Secondary | ICD-10-CM | POA: Diagnosis not present

## 2022-02-05 DIAGNOSIS — I10 Essential (primary) hypertension: Secondary | ICD-10-CM | POA: Diagnosis not present

## 2022-02-05 LAB — CBC
HCT: 35.4 % — ABNORMAL LOW (ref 36.0–46.0)
Hemoglobin: 11.8 g/dL — ABNORMAL LOW (ref 12.0–15.0)
MCH: 29.3 pg (ref 26.0–34.0)
MCHC: 33.3 g/dL (ref 30.0–36.0)
MCV: 87.8 fL (ref 80.0–100.0)
Platelets: 297 10*3/uL (ref 150–400)
RBC: 4.03 MIL/uL (ref 3.87–5.11)
RDW: 14.5 % (ref 11.5–15.5)
WBC: 10 10*3/uL (ref 4.0–10.5)
nRBC: 0 % (ref 0.0–0.2)

## 2022-02-05 LAB — RESP PANEL BY RT-PCR (FLU A&B, COVID) ARPGX2
Influenza A by PCR: NEGATIVE
Influenza B by PCR: NEGATIVE
SARS Coronavirus 2 by RT PCR: NEGATIVE

## 2022-02-05 LAB — URINALYSIS, ROUTINE W REFLEX MICROSCOPIC
Bilirubin Urine: NEGATIVE
Glucose, UA: NEGATIVE mg/dL
Hgb urine dipstick: NEGATIVE
Ketones, ur: NEGATIVE mg/dL
Nitrite: POSITIVE — AB
Protein, ur: NEGATIVE mg/dL
Specific Gravity, Urine: 1.015 (ref 1.005–1.030)
pH: 6 (ref 5.0–8.0)

## 2022-02-05 LAB — BASIC METABOLIC PANEL
Anion gap: 4 — ABNORMAL LOW (ref 5–15)
BUN: 10 mg/dL (ref 6–20)
CO2: 26 mmol/L (ref 22–32)
Calcium: 9.4 mg/dL (ref 8.9–10.3)
Chloride: 112 mmol/L — ABNORMAL HIGH (ref 98–111)
Creatinine, Ser: 0.71 mg/dL (ref 0.44–1.00)
GFR, Estimated: >60 mL/min (ref >60)
Glucose, Bld: 106 mg/dL — ABNORMAL HIGH (ref 70–99)
Potassium: 3.7 mmol/L (ref 3.5–5.1)
Sodium: 142 mmol/L (ref 135–145)

## 2022-02-05 LAB — URINALYSIS, MICROSCOPIC (REFLEX)

## 2022-02-05 LAB — POC URINE PREG, ED: Preg Test, Ur: NEGATIVE

## 2022-02-05 MED ORDER — CEPHALEXIN 500 MG PO CAPS
500.0000 mg | ORAL_CAPSULE | Freq: Once | ORAL | Status: AC
  Administered 2022-02-05: 500 mg via ORAL
  Filled 2022-02-05: qty 1

## 2022-02-05 MED ORDER — ONDANSETRON 4 MG PO TBDP: 4.0000 mg | ORAL_TABLET | Freq: Three times a day (TID) | ORAL | 0 refills | Status: DC | PRN

## 2022-02-05 MED ORDER — CEPHALEXIN 500 MG PO CAPS: 500.0000 mg | ORAL_CAPSULE | Freq: Three times a day (TID) | ORAL | 0 refills | Status: AC

## 2022-02-05 NOTE — ED Provider Notes (Signed)
[facility] Emergency Department Provider Note     Event Date/Time   First MD Initiated Contact with Patient 02/05/22 1333     (approximate)   History   Shortness of Breath and Weakness   HPI  Ana Cannon is a 51 y.o. female with a history of HTN, HLD, and tobacco use, presents to the ED with 1 week complaint of weakness and shortness of breath.  Patient reports feeling dehydrated.  She denies any frank chest pain, cough, or congestion.     Physical Exam   Triage Vital Signs: ED Triage Vitals [02/05/22 1205]  Enc Vitals Group     BP (!) 135/96     Pulse Rate 88     Resp 18     Temp 99 F (37.2 C)     Temp Source Oral     SpO2 92 %     Weight 144 lb (65.3 kg)     Height 5\' 7"  (1.702 m)     Head Circumference      Peak Flow      Pain Score 0     Pain Loc      Pain Edu?      Excl. in [place]?     Most recent vital signs: Vitals:   02/05/22 1205 02/05/22 1547  BP: (!) 135/96 (!) 134/104  Pulse: 88 75  Resp: 18 18  Temp: 99 F (37.2 C) 98.6 F (37 C)  SpO2: 92% 100%    General Awake, no distress. NAD HEENT NCAT. PERRL. EOMI. No rhinorrhea. Mucous membranes are moist.  CV:  Good peripheral perfusion.  RESP:  Normal effort.  ABD:  No distention.   ED Results / Procedures / Treatments   Labs (all labs ordered are listed, but only abnormal results are displayed) Labs Reviewed  BASIC METABOLIC PANEL - Abnormal; Notable for the following components:      Result Value   Chloride 112 (*)    Glucose, Bld 106 (*)    Anion gap 4 (*)    All other components within normal limits  CBC - Abnormal; Notable for the following components:   Hemoglobin 11.8 (*)    HCT 35.4 (*)    All other components within normal limits  URINALYSIS, ROUTINE W REFLEX MICROSCOPIC - Abnormal; Notable for the following components:   Nitrite POSITIVE (*)    Leukocytes,Ua TRACE (*)    All other components within normal limits  URINALYSIS, MICROSCOPIC  (REFLEX) - Abnormal; Notable for the following components:   Bacteria, UA MANY (*)    All other components within normal limits  RESP PANEL BY RT-PCR (FLU A&B, COVID) ARPGX2  URINE CULTURE  POC URINE PREG, ED  CBG MONITORING, ED     EKG  Vent. rate 82 BPM PR interval 146 ms QRS duration 66 ms QT/QTcB 370/432 ms P-R-T axes 62 21 65  RADIOLOGY  I personally viewed and evaluated these images as part of my medical decision making, as well as reviewing the written report by the radiologist.  ED Provider Interpretation: no acute findings  DG Chest 2 View  Result Date: 02/05/2022 CLINICAL DATA:  Shortness of breath EXAM: CHEST - 2 VIEW COMPARISON:  11/02/2020 FINDINGS: Heart and mediastinal contours are within normal limits. No focal opacities or effusions. No acute bony abnormality. IMPRESSION: No active cardiopulmonary disease. Electronically Signed   By: 11/04/2020 M.D.   On: 02/05/2022 14:13     PROCEDURES:  Critical Care performed:  No  Procedures   MEDICATIONS ORDERED IN ED: Medications  cephALEXin (KEFLEX) capsule 500 mg (500 mg Oral Given 02/05/22 1703)     IMPRESSION / MDM / ASSESSMENT AND PLAN / ED COURSE  I reviewed the triage vital signs and the nursing notes.                              Differential diagnosis includes, but is not limited to, viral syndrome, bronchitis including COPD exacerbation, pneumonia, reactive airway disease including asthma, CHF including exacerbation with or without pulmonary/interstitial edema, pneumothorax, ACS, thoracic trauma, and pulmonary embolism.   Patient's presentation is most consistent with acute complicated illness / injury requiring diagnostic workup.  Patient to the ED for evaluation of generalized weakness and loss of breath.  She is evaluated for her complaints in the ED, found have reassuring work-up overall.  No signs of any acute leukocytosis, critical anemia, or electrolyte abnormalities.  No radiologic evidence  of any acute intrathoracic process.  Patient's viral panel screen also negative.  Her UA did show significant bacteriuria with nitrites.  Patient's diagnosis is consistent with UTI. Patient will be discharged home with prescriptions for Keflex and Zofran. Patient is to follow up with primary provider as needed or otherwise directed. Patient is given ED precautions to return to the ED for any worsening or new symptoms.     FINAL CLINICAL IMPRESSION(S) / ED DIAGNOSES   Final diagnoses:  Acute cystitis without hematuria     Rx / DC Orders   ED Discharge Orders          Ordered    cephALEXin (KEFLEX) 500 MG capsule  3 times daily        02/05/22 1652    ondansetron (ZOFRAN-ODT) 4 MG disintegrating tablet  Every 8 hours PRN        02/05/22 1652             Note:  This document was prepared using Dragon voice recognition software and may include unintentional dictation errors.    Melvenia Needles, PA-C 02/05/22 1731    Blake Divine, MD 02/05/22 1940

## 2022-02-05 NOTE — ED Triage Notes (Signed)
Pt to ER with c/o weakness and SHOB since Monday.  Pt reports feeling dehydrated.  Pt denies other symptoms.

## 2022-02-05 NOTE — Discharge Instructions (Addendum)
Take the antibiotic as directed and the nausea medicine as needed. Return to the ED as needed.

## 2022-02-07 LAB — URINE CULTURE: Culture: >=100000 — AB

## 2022-02-10 ENCOUNTER — Emergency Department
Admission: EM | Admit: 2022-02-10 | Discharge: 2022-02-10 | Disposition: A | Discharge status: Home / Self Care | Payer: BC Managed Care – PPO | Attending: Emergency Medicine | Admitting: Emergency Medicine

## 2022-02-10 ENCOUNTER — Other Ambulatory Visit: Payer: Self-pay

## 2022-02-10 DIAGNOSIS — F22 Delusional disorders: Secondary | ICD-10-CM | POA: Diagnosis not present

## 2022-02-10 DIAGNOSIS — F17210 Nicotine dependence, cigarettes, uncomplicated: Secondary | ICD-10-CM | POA: Insufficient documentation

## 2022-02-10 DIAGNOSIS — F419 Anxiety disorder, unspecified: Secondary | ICD-10-CM | POA: Insufficient documentation

## 2022-02-10 DIAGNOSIS — F17200 Nicotine dependence, unspecified, uncomplicated: Secondary | ICD-10-CM | POA: Insufficient documentation

## 2022-02-10 DIAGNOSIS — R442 Other hallucinations: Secondary | ICD-10-CM

## 2022-02-10 DIAGNOSIS — I10 Essential (primary) hypertension: Secondary | ICD-10-CM | POA: Insufficient documentation

## 2022-02-10 DIAGNOSIS — R443 Hallucinations, unspecified: Secondary | ICD-10-CM | POA: Diagnosis present

## 2022-02-10 LAB — COMPREHENSIVE METABOLIC PANEL
ALT: 11 U/L (ref 0–44)
AST: 14 U/L — ABNORMAL LOW (ref 15–41)
Albumin: 3.9 g/dL (ref 3.5–5.0)
Alkaline Phosphatase: 56 U/L (ref 38–126)
Anion gap: 6 (ref 5–15)
BUN: 7 mg/dL (ref 6–20)
CO2: 24 mmol/L (ref 22–32)
Calcium: 9.1 mg/dL (ref 8.9–10.3)
Chloride: 110 mmol/L (ref 98–111)
Creatinine, Ser: 0.81 mg/dL (ref 0.44–1.00)
GFR, Estimated: >60 mL/min (ref >60)
Glucose, Bld: 147 mg/dL — ABNORMAL HIGH (ref 70–99)
Potassium: 3.5 mmol/L (ref 3.5–5.1)
Sodium: 140 mmol/L (ref 135–145)
Total Bilirubin: 1.2 mg/dL (ref 0.3–1.2)
Total Protein: 7.6 g/dL (ref 6.5–8.1)

## 2022-02-10 LAB — CBC
HCT: 36.6 % (ref 36.0–46.0)
Hemoglobin: 12.3 g/dL (ref 12.0–15.0)
MCH: 29.4 pg (ref 26.0–34.0)
MCHC: 33.6 g/dL (ref 30.0–36.0)
MCV: 87.4 fL (ref 80.0–100.0)
Platelets: 295 10*3/uL (ref 150–400)
RBC: 4.19 MIL/uL (ref 3.87–5.11)
RDW: 14 % (ref 11.5–15.5)
WBC: 15.4 10*3/uL — ABNORMAL HIGH (ref 4.0–10.5)
nRBC: 0 % (ref 0.0–0.2)

## 2022-02-10 LAB — ETHANOL: Alcohol, Ethyl (B): <10 mg/dL (ref <10)

## 2022-02-10 LAB — SALICYLATE LEVEL: Salicylate Lvl: <7 mg/dL — ABNORMAL LOW (ref 7.0–30.0)

## 2022-02-10 LAB — ACETAMINOPHEN LEVEL: Acetaminophen (Tylenol), Serum: <10 ug/mL — ABNORMAL LOW (ref 10–30)

## 2022-02-10 MED ORDER — RISPERIDONE 1 MG PO TABS: 1.0000 mg | ORAL_TABLET | Freq: Every day | ORAL | 0 refills | Status: DC

## 2022-02-10 MED ORDER — RISPERIDONE 1 MG PO TABS: 1.0000 mg | ORAL_TABLET | Freq: Every evening | ORAL | 0 refills | Status: DC

## 2022-02-10 NOTE — ED Triage Notes (Signed)
Arrives c/o tactile hallucinations.  Started on Seroquel and Mirtazapine.  C/O being unable to sleep and feeling hallucinations  "feels like mucous is just coming out of my head."  AAOx3.  Calm and cooperative.  Denies SI/HI

## 2022-02-10 NOTE — ED Notes (Addendum)
Pt belongings:  1 pair of striped pants 1 pair of pink and blue underwear 1 striped tank 1 red jacket 1 black purse 1 cell phone 1 pair of black sandals   Pt also has nipple piercing that she cannot take out.

## 2022-02-10 NOTE — ED Provider Notes (Addendum)
[facility] Provider Note    Event Date/Time   First MD Initiated Contact with Patient 02/10/22 0945     (approximate)   History   Psychiatric Evaluation   HPI  Ana Cannon is a 51 y.o. female with medical history of depression delusions of infestation who presents with hallucinations.  I see primary care doctor appointment note from 8/31 where patient was noted to have delusions of infestation and anxiety.  Patient tells me she was started on Seroquel recently for this.  She has sensation like there is mucus that is running from the right side of her head.  Is been going on for some time.  Is causing her significant amount of distress to the point that she is now not able to sleep.  She is taking mirtazapine for sleep as well and is having difficulty.  She denies suicidal ideation denies other hallucinations she would like to see psychiatry today.     Past Medical History:  Diagnosis Date   Atypical chest pain    a. 05/2019 MV: EF>65%. No ischemia/scar-->treated w/ NSAIDS - ? pericarditis.   Diastolic dysfunction    a. 05/2019 Echo: EF 65-70%, mild LVH. Gr2 DD. No rwma. Nl RV size/fxn. Sm pericardial effusion. Triv MR.   Hyperlipemia    Hypertension    Tobacco abuse     Patient Active Problem List   Diagnosis Date Noted   Lumbosacral radiculopathy at L4 (left) 04/03/2021   Lumbar radiculopathy (left) 04/03/2021   Generalized anxiety disorder    Acute pericarditis    HTN (hypertension), benign    Chest pain 05/22/2019   Palpitations    Elevated troponin      Physical Exam  Triage Vital Signs: ED Triage Vitals [02/10/22 0928]  Enc Vitals Group     BP (!) 149/90     Pulse Rate 92     Resp 16     Temp 98.6 F (37 C)     Temp Source Oral     SpO2 (!) 9 %     Weight 143 lb 15.4 oz (65.3 kg)     Height 5\' 7"  (1.702 m)     Head Circumference      Peak Flow      Pain Score 8     Pain Loc      Pain Edu?      Excl. in [place]?     Most  recent vital signs: Vitals:   02/10/22 0928  BP: (!) 149/90  Pulse: 92  Resp: 16  Temp: 98.6 F (37 C)  SpO2: (!) 9%     General: Awake, no distress.  CV:  Good peripheral perfusion.  Resp:  Normal effort.  Abd:  No distention.  Neuro:             Awake, Alert, Oriented x 3  Other:  Patient is calm and cooperative   ED Results / Procedures / Treatments  Labs (all labs ordered are listed, but only abnormal results are displayed) Labs Reviewed  COMPREHENSIVE METABOLIC PANEL - Abnormal; Notable for the following components:      Result Value   Glucose, Bld 147 (*)    AST 14 (*)    All other components within normal limits  SALICYLATE LEVEL - Abnormal; Notable for the following components:   Salicylate Lvl <5.8 (*)    All other components within normal limits  ACETAMINOPHEN LEVEL - Abnormal; Notable for the following components:   Acetaminophen (Tylenol),  Serum <10 (*)    All other components within normal limits  CBC - Abnormal; Notable for the following components:   WBC 15.4 (*)    All other components within normal limits  ETHANOL  URINE DRUG SCREEN, QUALITATIVE ([facility] ONLY)  POC URINE PREG, ED     EKG     RADIOLOGY    PROCEDURES:  Critical Care performed: No  Procedures   MEDICATIONS ORDERED IN ED: Medications - No data to display   IMPRESSION / MDM / ASSESSMENT AND PLAN / ED COURSE  I reviewed the triage vital signs and the nursing notes.                              Patient's presentation is most consistent with exacerbation of chronic illness.  Differential diagnosis includes, but is not limited to, delusional disorder, substance-induced, depression with psychosis  Patient is a 51 year old female with history of delusions of parasitosis who presents with ongoing sensation that there is mucus pouring from her head which is now causing her significant moderate distress.  Has no other hallucinations and is not otherwise psychotic but she is not  not able to sleep because of this.  She is on mirtazapine and was recently started on Seroquel by her primary doctor who has been following for this.  She does not have a therapist or psychiatrist.  She is now at her wits end and would like help.  She is not suicidal.  Do not think she needs to be IVC at this time but will consult psychiatry to help see if they have any other suggestions as far as medications.  The patient has been placed in psychiatric observation due to the need to provide a safe environment for the patient while obtaining psychiatric consultation and evaluation, as well as ongoing medical and medication management to treat the patient's condition.  The patient has not been placed under full IVC at this time.     Patient was seen by psychiatry and cleared will be started on risperidone.   FINAL CLINICAL IMPRESSION(S) / ED DIAGNOSES   Final diagnoses:  Delusional disorder (HCC)     Rx / DC Orders   ED Discharge Orders     None        Note:  This document was prepared using Dragon voice recognition software and may include unintentional dictation errors.   Georga Hacking, MD 02/10/22 1339    Georga Hacking, MD 02/10/22 [phone]

## 2022-02-10 NOTE — Discharge Instructions (Signed)
Please start taking the risperidone as prescribed by Dr. Weber Cooks.

## 2022-02-10 NOTE — ED Triage Notes (Signed)
Pt here with hallucinations. Pt states she was just started on Seroquel but it is not helping. Pt states she feels like mucus draining out of her head which she knows is not there. Pt states she also has shocks in the shower. Pt also states she has not slept all night and it is getting worse.

## 2022-02-10 NOTE — Consult Note (Signed)
[facility] Consult   Reason for Consult: Consult for 51 year old woman with a history of tactile hallucinations comes to the ER because they are worsening and keeping her up at night Referring Physician: Starleen Blue Patient Identification: KEALY LEWTER MRN:  [id number] Principal Diagnosis: Tactile hallucination Diagnosis:  Principal Problem:   Tactile hallucination   Total Time spent with patient: 45 minutes  Subjective:   Ana Cannon is a 51 y.o. female patient admitted with "it feels like mucus, which we both know is not really there".  HPI: Patient seen and chart reviewed.  Patient complains of symptoms consistent with what is been documented previously in mental health notes.  She has a chronic sensation that she has mucus slime more liquid running out of her nose and down her face.  Additionally she says at times and with increasing frequency and will feel like she has a similar unpleasant fluid on her head.  She does not have delusions she recognizes that this is not really true.  Nevertheless she says she has had to cut her hair off to try and improve the feeling and that she frequently wipes herself off with a towel.  She has been seeing at the mental health provider or primary care provider at [facility] who has been trying to treat her for it.  Patient says it has been getting worse recently.  Kept her up all night.  She did not feel like she was capable of going to work today.  She also says she feels electric shock sorts of feelings in her skin when she takes a shower.  Patient has been recently prescribed gabapentin which she has been taking 3 times a day with minimal to no relief.  She is on chronic mirtazapine 45 mg at night.  She had been prescribed Seroquel 25 mg at night which she thought might have helped slightly at first but recently has not helped.  Denies any other hallucinations.  Mood stays anxious and depressed much of the time.  She reviews with me the  multiple life stresses she has including the death of a child within the last couple years and the fact that she works 2 full-time jobs.  She denies suicidal or homicidal thoughts.  She says she does have an upcoming mental health appointment in a couple weeks  Past Psychiatric History: No previous hospitalization.  Says she made a suicide attempt when she was an adolescent but would never do it again.  Used to drink a fair bit but has stopped denies any recent alcohol or drug use.  Risk to Self:   Risk to Others:   Prior Inpatient Therapy:   Prior Outpatient Therapy:    Past Medical History:  Past Medical History:  Diagnosis Date   Atypical chest pain    a. 05/2019 MV: EF>65%. No ischemia/scar-->treated w/ NSAIDS - ? pericarditis.   Diastolic dysfunction    a. 05/2019 Echo: EF 65-70%, mild LVH. Gr2 DD. No rwma. Nl RV size/fxn. Sm pericardial effusion. Triv MR.   Hyperlipemia    Hypertension    Tobacco abuse    No past surgical history on file. Family History:  Family History  Problem Relation Age of Onset   CAD Mother    CAD Father    Family Psychiatric  History: None reported Social History:  Social History   Substance and Sexual Activity  Alcohol Use Not Currently     Social History   Substance and Sexual Activity  Drug Use No  Social History   Socioeconomic History   Marital status: Single    Spouse name: Not on file   Number of children: Not on file   Years of education: Not on file   Highest education level: Not on file  Occupational History   Not on file  Tobacco Use   Smoking status: Every Day    Packs/day: 0.50    Types: Cigarettes   Smokeless tobacco: Never  Vaping Use   Vaping Use: Never used  Substance and Sexual Activity   Alcohol use: Not Currently   Drug use: No   Sexual activity: Not on file  Other Topics Concern   Not on file  Social History Narrative   Not on file   Social Determinants of Health   Financial Resource Strain: Not on  file  Food Insecurity: Not on file  Transportation Needs: Not on file  Physical Activity: Not on file  Stress: Not on file  Social Connections: Not on file   Additional Social History:    Allergies:  No Known Allergies  Labs:  Results for orders placed or performed during the hospital encounter of 02/10/22 (from the past 48 hour(s))  Comprehensive metabolic panel     Status: Abnormal   Collection Time: 02/10/22  9:30 AM  Result Value Ref Range   Sodium 140 135 - 145 mmol/L   Potassium 3.5 3.5 - 5.1 mmol/L   Chloride 110 98 - 111 mmol/L   CO2 24 22 - 32 mmol/L   Glucose, Bld 147 (H) 70 - 99 mg/dL    Comment: Glucose reference range applies only to samples taken after fasting for at least 8 hours.   BUN 7 6 - 20 mg/dL   Creatinine, Ser 0.81 0.44 - 1.00 mg/dL   Calcium 9.1 8.9 - 10.3 mg/dL   Total Protein 7.6 6.5 - 8.1 g/dL   Albumin 3.9 3.5 - 5.0 g/dL   AST 14 (L) 15 - 41 U/L   ALT 11 0 - 44 U/L   Alkaline Phosphatase 56 38 - 126 U/L   Total Bilirubin 1.2 0.3 - 1.2 mg/dL   GFR, Estimated >60 >60 mL/min    Comment: (NOTE) Calculated using the CKD-EPI Creatinine Equation (2021)    Anion gap 6 5 - 15    Comment: Performed at [facility], [address] [place], [place] [address]  Ethanol     Status: None   Collection Time: 02/10/22  9:30 AM  Result Value Ref Range   Alcohol, Ethyl (B) <10 <10 mg/dL    Comment: (NOTE) Lowest detectable limit for serum alcohol is 10 mg/dL.  For medical purposes only. Performed at [facility], [address] [place], [place] [id number]   Salicylate level     Status: Abnormal   Collection Time: 02/10/22  9:30 AM  Result Value Ref Range   Salicylate Lvl Q000111Q (L) 7.0 - 30.0 mg/dL    Comment: Performed at [facility], [address] [place], [place] [address]  Acetaminophen level     Status: Abnormal   Collection Time: 02/10/22  9:30 AM  Result Value Ref Range   Acetaminophen (Tylenol), Serum <10 (L) 10  - 30 ug/mL    Comment: (NOTE) Therapeutic concentrations vary significantly. A range of 10-30 ug/mL  may be an effective concentration for many patients. However, some  are best treated at concentrations outside of this range. Acetaminophen concentrations >150 ug/mL at 4 hours after ingestion  and >50 ug/mL at 12  hours after ingestion are often associated with  toxic reactions.  Performed at [facility], [address] [place], [place] [address]   cbc     Status: Abnormal   Collection Time: 02/10/22  9:30 AM  Result Value Ref Range   WBC 15.4 (H) 4.0 - 10.5 K/uL   RBC 4.19 3.87 - 5.11 MIL/uL   Hemoglobin 12.3 12.0 - 15.0 g/dL   HCT 36.6 36.0 - 46.0 %   MCV 87.4 80.0 - 100.0 fL   MCH 29.4 26.0 - 34.0 pg   MCHC 33.6 30.0 - 36.0 g/dL   RDW 14.0 11.5 - 15.5 %   Platelets 295 150 - 400 K/uL   nRBC 0.0 0.0 - 0.2 %    Comment: Performed at [facility], [address] [place], [place] [address]    No current facility-administered medications for this encounter.   Current Outpatient Medications  Medication Sig Dispense Refill   risperiDONE (RISPERDAL) 1 MG tablet Take 1 tablet (1 mg total) by mouth at bedtime. 30 tablet 0   aluminum-magnesium hydroxide-simethicone (MAALOX) [id number] MG/5ML SUSP Take 30 mLs by mouth 4 (four) times daily -  before meals and at bedtime. 355 mL 0   cephALEXin (KEFLEX) 500 MG capsule Take 1 capsule (500 mg total) by mouth 3 (three) times daily for 7 days. 21 capsule 0   clonazePAM (KLONOPIN) 0.5 MG tablet Take 0.5 mg by mouth 2 (two) times daily as needed.     diclofenac (VOLTAREN) 75 MG EC tablet Take 75 mg by mouth 2 (two) times daily.     escitalopram (LEXAPRO) 5 MG tablet Take 1 tablet (5 mg total) by mouth daily. 30 tablet 0   famotidine (PEPCID) 20 MG tablet Take 1 tablet (20 mg total) by mouth 2 (two) times daily. 60 tablet 0   Fluocinolone Acetonide Scalp 0.01 % OIL Wet or dampen hair and scalp thoroughly. Apply a thin film  of fluocinolone acetonide topical oil, 0.01% on the scalp, massage well and cover scalp with the supplied shower cap. Leave on overnight or for a minimum of 4 hours before washing off. Wash hair with regular shampoo and rinse thoroughly. 118.28 mL 0   gabapentin (NEURONTIN) 300 MG capsule Take 300 mg by mouth 3 (three) times daily.     meclizine (ANTIVERT) 25 MG tablet Take 1 tablet (25 mg total) by mouth 3 (three) times daily as needed for dizziness. 20 tablet 0   metoprolol tartrate (LOPRESSOR) 50 MG tablet Take 1 tablet (50 mg total) by mouth 2 (two) times daily. 180 tablet 3   mirtazapine (REMERON) 45 MG tablet Take 45 mg by mouth at bedtime.     norethindrone (AYGESTIN) 5 MG tablet Take 5 mg by mouth daily.     ondansetron (ZOFRAN-ODT) 4 MG disintegrating tablet Take 1 tablet (4 mg total) by mouth every 8 (eight) hours as needed for nausea or vomiting. 15 tablet 0   pantoprazole (PROTONIX) 20 MG tablet Take 1 tablet (20 mg total) by mouth daily. 30 tablet 0   QUEtiapine (SEROQUEL) 25 MG tablet Take 25 mg by mouth at bedtime.     risperiDONE (RISPERDAL) 1 MG tablet Take 1 tablet (1 mg total) by mouth at bedtime. 30 tablet 0   sertraline (ZOLOFT) 100 MG tablet Take 100 mg by mouth daily.     sertraline (ZOLOFT) 50 MG tablet Take 50 mg by mouth daily.     valACYclovir (VALTREX) 500 MG tablet Take 500 mg by  mouth 2 (two) times daily.      Musculoskeletal: Strength & Muscle Tone: within normal limits Gait & Station: normal Patient leans: N/A            Psychiatric Specialty Exam:  Presentation  General Appearance: No data recorded Eye Contact:No data recorded Speech:No data recorded Speech Volume:No data recorded Handedness:No data recorded  Mood and Affect  Mood:No data recorded Affect:No data recorded  Thought Process  Thought Processes:No data recorded Descriptions of Associations:No data recorded Orientation:No data recorded Thought Content:No data recorded History  of Schizophrenia/Schizoaffective disorder:No data recorded Duration of Psychotic Symptoms:No data recorded Hallucinations:No data recorded Ideas of Reference:No data recorded Suicidal Thoughts:No data recorded Homicidal Thoughts:No data recorded  Sensorium  Memory:No data recorded Judgment:No data recorded Insight:No data recorded  Executive Functions  Concentration:No data recorded Attention Span:No data recorded Recall:No data recorded Fund of Knowledge:No data recorded Language:No data recorded  Psychomotor Activity  Psychomotor Activity:No data recorded  Assets  Assets:No data recorded  Sleep  Sleep:No data recorded  Physical Exam: Physical Exam Vitals and nursing note reviewed.  Constitutional:      Appearance: Normal appearance.  HENT:     Head: Normocephalic and atraumatic.     Mouth/Throat:     Pharynx: Oropharynx is clear.  Eyes:     Pupils: Pupils are equal, round, and reactive to light.  Cardiovascular:     Rate and Rhythm: Normal rate and regular rhythm.  Pulmonary:     Effort: Pulmonary effort is normal.     Breath sounds: Normal breath sounds.  Abdominal:     General: Abdomen is flat.     Palpations: Abdomen is soft.  Musculoskeletal:        General: Normal range of motion.  Skin:    General: Skin is warm and dry.  Neurological:     General: No focal deficit present.     Mental Status: She is alert. Mental status is at baseline.  Psychiatric:        Attention and Perception: Attention normal.        Mood and Affect: Mood is anxious.        Speech: Speech normal.        Behavior: Behavior is cooperative.        Thought Content: Thought content normal.        Cognition and Memory: Cognition normal.    Review of Systems  Constitutional: Negative.   HENT: Negative.    Eyes: Negative.   Respiratory: Negative.    Cardiovascular: Negative.   Gastrointestinal: Negative.   Musculoskeletal: Negative.   Skin: Negative.   Neurological:  Negative.   Psychiatric/Behavioral:  Positive for hallucinations. The patient is nervous/anxious and has insomnia.    Blood pressure 135/74, pulse 80, temperature 98.6 F (37 C), temperature source Oral, resp. rate 17, height 5\' 7"  (1.702 m), weight 65.3 kg, SpO2 95 %. Body mass index is 22.55 kg/m.  Treatment Plan Summary: Medication management and Plan anxious and dysphoric woman but not psychotic not suicidal.  The tactile hallucinations are not accompanied by delusions and so are almost more like anxiety related paresthesias than true hallucinations.  I suspect a lot of this is related to depression and anxiety.  Patient does not however meet criteria for admission.  She is most anxious for something that might help her acutely with the symptoms.  I offered to olanzapine but she said she had been prescribed that in the past and had not been helpful.  Instead we  will try adding Risperdal at nighttime 1 mg which we can substitute for the Seroquel.  Follow-up with primary provider and psychiatric appointment.  Case reviewed with the ER physician.  Disposition: No evidence of imminent risk to self or others at present.   Patient does not meet criteria for psychiatric inpatient admission. Supportive therapy provided about ongoing stressors.  Alethia Berthold, MD 02/10/2022 4:03 PM

## 2022-02-10 NOTE — ED Notes (Signed)
E-signature not working at this time. Pt verbalized understanding of D/C instructions, prescriptions and follow up care with no further questions at this time. Pt in NAD and ambulatory at time of D/C.  

## 2022-06-09 ENCOUNTER — Encounter: Payer: Self-pay | Admitting: Emergency Medicine

## 2022-06-09 ENCOUNTER — Emergency Department
Admission: EM | Admit: 2022-06-09 | Discharge: 2022-06-09 | Disposition: A | Discharge status: Home / Self Care | Payer: BC Managed Care – PPO | Attending: Emergency Medicine | Admitting: Emergency Medicine

## 2022-06-09 ENCOUNTER — Other Ambulatory Visit: Payer: Self-pay

## 2022-06-09 DIAGNOSIS — R5383 Other fatigue: Secondary | ICD-10-CM | POA: Insufficient documentation

## 2022-06-09 DIAGNOSIS — R630 Anorexia: Secondary | ICD-10-CM | POA: Diagnosis not present

## 2022-06-09 DIAGNOSIS — I10 Essential (primary) hypertension: Secondary | ICD-10-CM | POA: Insufficient documentation

## 2022-06-09 DIAGNOSIS — Z20822 Contact with and (suspected) exposure to covid-19: Secondary | ICD-10-CM | POA: Insufficient documentation

## 2022-06-09 DIAGNOSIS — R5381 Other malaise: Secondary | ICD-10-CM | POA: Insufficient documentation

## 2022-06-09 LAB — RESP PANEL BY RT-PCR (RSV, FLU A&B, COVID)  RVPGX2
Influenza A by PCR: NEGATIVE
Influenza B by PCR: NEGATIVE
Resp Syncytial Virus by PCR: NEGATIVE
SARS Coronavirus 2 by RT PCR: NEGATIVE

## 2022-06-09 MED ORDER — MIRTAZAPINE 45 MG PO TABS: 45.0000 mg | ORAL_TABLET | Freq: Every day | ORAL | 0 refills | Status: AC

## 2022-06-09 NOTE — ED Triage Notes (Signed)
Patient to ED for over all not feeling well. States not been eating much lately. States hasn't had her depression medication in the past week. Denies dizziness or lightheadedness.

## 2022-06-09 NOTE — Discharge Instructions (Addendum)
Take the Remeron as prescribed.  Keep your scheduled appointment with primary care.  Return to the ER for symptoms that change or worsen if unable to schedule an appointment.

## 2022-06-09 NOTE — ED Provider Notes (Signed)
[facility] Provider Note    Event Date/Time   First MD Initiated Contact with Patient 06/09/22 1124     (approximate)   History   Fatigue   HPI  Ana Cannon is a 52 y.o. female with history of hypertension, anxiety, tactile hallucinations, diastolic dysfunction and as listed in EMR presents to the emergency department for treatment and evaluation of decreased appetite and general malaise. She has been out of mirtazapine for over a week due to insurance. That is the only medication that seems to help her sleep and gives her an appetite.      Physical Exam   Triage Vital Signs: ED Triage Vitals  Enc Vitals Group     BP 06/09/22 0950 (!) 134/106     Pulse Rate 06/09/22 0950 95     Resp 06/09/22 0950 18     Temp 06/09/22 0950 99.4 F (37.4 C)     Temp Source 06/09/22 0950 Oral     SpO2 06/09/22 0950 100 %     Weight --      Height --      Head Circumference --      Peak Flow --      Pain Score 06/09/22 0951 0     Pain Loc --      Pain Edu? --      Excl. in [place]? --     Most recent vital signs: Vitals:   06/09/22 0950  BP: (!) 134/106  Pulse: 95  Resp: 18  Temp: 99.4 F (37.4 C)  SpO2: 100%    General: Awake, no distress.  CV:  Good peripheral perfusion.  Resp:  Normal effort.  Abd:  No distention.  Other:     ED Results / Procedures / Treatments   Labs (all labs ordered are listed, but only abnormal results are displayed) Labs Reviewed  RESP PANEL BY RT-PCR (RSV, FLU A&B, COVID)  RVPGX2     EKG  Not indicated   RADIOLOGY   PROCEDURES:  Critical Care performed: No  Procedures   MEDICATIONS ORDERED IN ED:  Medications - No data to display   IMPRESSION / MDM / [place] / ED COURSE   I have reviewed the triage note.  Differential diagnosis includes, but is not limited to, Covid, influenza, RSV, depression/anxiety  Patient's presentation is most consistent with acute illness / injury with  system symptoms.  52 year old female presenting to the emergency department for treatment and evaluation of fatigue and generalized weakness.  She has no cough, fever, nausea, vomiting, diarrhea, sore throat, or other viral symptoms.  Respiratory panel is negative for acute concerns.  Symptoms seem most likely related to not having her Remeron for about a week.  She is having some issues with the insurance company and pharmacy.  The pharmacy states that the insurance company requires her to have that medication mailed to her.  I called and spoke with the pharmacist at the local [facility] where the patient gets her medications filled.  If the patient desires, she can pay out-of-pocket for it, otherwise she has to talk with her insurance company to allow a local pharmacy to fill it for her.  Patient would like the prescription to be submitted for 30 days and she will try and pay for it out-of-pocket.  Patient states that she has an appointment scheduled this week with her primary care provider.  If she has any other symptom of concern or is unable to get  her medications, she is to discuss that at her visit.     FINAL CLINICAL IMPRESSION(S) / ED DIAGNOSES   Final diagnoses:  Other fatigue     Rx / DC Orders   ED Discharge Orders          Ordered    mirtazapine (REMERON) 45 MG tablet  Daily at bedtime        06/09/22 1203             Note:  This document was prepared using Dragon voice recognition software and may include unintentional dictation errors.   Victorino Dike, FNP 06/09/22 1826    Harvest Dark, MD 06/12/22 1545

## 2022-06-26 ENCOUNTER — Emergency Department: Payer: BC Managed Care – PPO

## 2022-06-26 ENCOUNTER — Other Ambulatory Visit: Payer: Self-pay

## 2022-06-26 ENCOUNTER — Emergency Department
Admission: EM | Admit: 2022-06-26 | Discharge: 2022-06-26 | Disposition: A | Discharge status: Home / Self Care | Payer: BC Managed Care – PPO | Attending: Student in an Organized Health Care Education/Training Program | Admitting: Student in an Organized Health Care Education/Training Program

## 2022-06-26 DIAGNOSIS — I10 Essential (primary) hypertension: Secondary | ICD-10-CM

## 2022-06-26 DIAGNOSIS — R42 Dizziness and giddiness: Secondary | ICD-10-CM | POA: Diagnosis present

## 2022-06-26 LAB — BASIC METABOLIC PANEL
Anion gap: 5 (ref 5–15)
BUN: 9 mg/dL (ref 6–20)
CO2: 25 mmol/L (ref 22–32)
Calcium: 8.7 mg/dL — ABNORMAL LOW (ref 8.9–10.3)
Chloride: 105 mmol/L (ref 98–111)
Creatinine, Ser: 0.65 mg/dL (ref 0.44–1.00)
GFR, Estimated: >60 mL/min (ref >60)
Glucose, Bld: 88 mg/dL (ref 70–99)
Potassium: 3.6 mmol/L (ref 3.5–5.1)
Sodium: 135 mmol/L (ref 135–145)

## 2022-06-26 LAB — CBC
HCT: 36.3 % (ref 36.0–46.0)
Hemoglobin: 12.3 g/dL (ref 12.0–15.0)
MCH: 29.5 pg (ref 26.0–34.0)
MCHC: 33.9 g/dL (ref 30.0–36.0)
MCV: 87.1 fL (ref 80.0–100.0)
Platelets: 304 10*3/uL (ref 150–400)
RBC: 4.17 MIL/uL (ref 3.87–5.11)
RDW: 13.9 % (ref 11.5–15.5)
WBC: 7.7 10*3/uL (ref 4.0–10.5)
nRBC: 0 % (ref 0.0–0.2)

## 2022-06-26 LAB — URINALYSIS, ROUTINE W REFLEX MICROSCOPIC
Bilirubin Urine: NEGATIVE
Glucose, UA: NEGATIVE mg/dL
Hgb urine dipstick: NEGATIVE
Ketones, ur: NEGATIVE mg/dL
Leukocytes,Ua: NEGATIVE
Nitrite: NEGATIVE
Protein, ur: NEGATIVE mg/dL
Specific Gravity, Urine: 1.011 (ref 1.005–1.030)
pH: 6 (ref 5.0–8.0)

## 2022-06-26 LAB — TROPONIN I (HIGH SENSITIVITY)
Troponin I (High Sensitivity): 3 ng/L (ref <18)
Troponin I (High Sensitivity): 3 ng/L (ref <18)

## 2022-06-26 MED ORDER — AMLODIPINE BESYLATE 5 MG PO TABS
2.5000 mg | ORAL_TABLET | Freq: Once | ORAL | Status: AC
  Administered 2022-06-26: 2.5 mg via ORAL
  Filled 2022-06-26: qty 1

## 2022-06-26 MED ORDER — AMLODIPINE BESYLATE 5 MG PO TABS: 5.0000 mg | ORAL_TABLET | Freq: Every day | ORAL | 0 refills | Status: AC

## 2022-06-26 NOTE — ED Triage Notes (Signed)
Pt to ED via POV from home. Pt reports dizziness and mild HA that started this morning. Pt reports she believes it is due to her BP being elevated. Pt reports PCP has taken her off BP medications. Pt denies vision changes, SOB or CP.

## 2022-06-26 NOTE — ED Provider Notes (Signed)
[facility] Provider Note    Event Date/Time   First MD Initiated Contact with Patient 06/26/22 [id number]     (approximate)   History   Dizziness   HPI  DESIREE DRAGOTTA is a 52 y.o. female with remote history of hypertension as well as some psychiatric illness presents to the ER for evaluation of mild headache dizziness and concern over elevated blood pressure.  States that she does feel slightly lightheaded and dizzy.  Symptoms are very mild currently is also had some chest tightness denies any chest pain.  Not currently taking antihypertensive medications.  Was previously on HCTZ.      Physical Exam   Triage Vital Signs: ED Triage Vitals  Enc Vitals Group     BP 06/26/22 0901 (!) 140/100     Pulse Rate 06/26/22 0901 (!) 109     Resp 06/26/22 0901 16     Temp 06/26/22 0901 98.9 F (37.2 C)     Temp Source 06/26/22 0901 Oral     SpO2 06/26/22 0901 99 %     Weight 06/26/22 0901 147 lb (66.7 kg)     Height 06/26/22 0901 5' 7"$  (1.702 m)     Head Circumference --      Peak Flow --      Pain Score 06/26/22 0908 0     Pain Loc --      Pain Edu? --      Excl. in [place]? --     Most recent vital signs: Vitals:   06/26/22 1139 06/26/22 1146  BP: (!) 151/114   Pulse:    Resp:    Temp:    SpO2:  100%     Constitutional: Alert  Eyes: Conjunctivae are normal.  Head: Atraumatic. Nose: No congestion/rhinnorhea. Mouth/Throat: Mucous membranes are moist.   Neck: Painless ROM.  Cardiovascular:   Good peripheral circulation. Respiratory: Normal respiratory effort.  No retractions.  Gastrointestinal: Soft and nontender.  Musculoskeletal:  no deformity Neurologic:  CN- intact.  No facial droop, Normal FNF.  Normal heel to shin.  Sensation intact bilaterally. Normal speech and language. No gross focal neurologic deficits are appreciated. No gait instability.  Skin:  Skin is warm, dry and intact. No rash noted. Psychiatric: Mood and affect are normal.  Speech and behavior are normal.    ED Results / Procedures / Treatments   Labs (all labs ordered are listed, but only abnormal results are displayed) Labs Reviewed  BASIC METABOLIC PANEL - Abnormal; Notable for the following components:      Result Value   Calcium 8.7 (*)    All other components within normal limits  URINALYSIS, ROUTINE W REFLEX MICROSCOPIC - Abnormal; Notable for the following components:   Color, Urine YELLOW (*)    APPearance HAZY (*)    All other components within normal limits  CBC  POC URINE PREG, ED  TROPONIN I (HIGH SENSITIVITY)  TROPONIN I (HIGH SENSITIVITY)     EKG  ED ECG REPORT I, Merlyn Lot, the attending physician, personally viewed and interpreted this ECG.   Date: 06/26/2022  EKG Time: 9:18  Rate: 97  Rhythm: sinus  Axis: normal  Intervals: normal  ST&T Change: no stemi, no depressions    RADIOLOGY .pri53m   PROCEDURES:  Critical Care performed:   Procedures   MEDICATIONS ORDERED IN ED: Medications  amLODipine (NORVASC) tablet 2.5 mg (2.5 mg Oral Given 06/26/22 1139)     IMPRESSION / MDM / ASSESSMENT AND  PLAN / ED COURSE  I reviewed the triage vital signs and the nursing notes.                              Differential diagnosis includes, but is not limited to, hyper tension, hypertensive emergency, SAH, IPH, CVA, ACS, renal insufficiency  Patient presenting to the ER for evaluation of symptoms as described above.  Based on symptoms, risk factors and considered above differential, this presenting complaint could reflect a potentially life-threatening illness therefore the patient will be placed on continuous pulse oximetry and telemetry for monitoring.  Laboratory evaluation will be sent to evaluate for the above complaints.  She is well-appearing but given age and risk factors will order imaging to further evaluate for the blood differential.   Clinical Course as of 06/26/22 1159  Fri Jun 26, 2022  0948 Chest  x-ray on my review and interpretation without evidence of consolidation or infiltrate. [PR]  1157 Workup is reassuring.  Given her hypertension we will start on Norvasc.  Does appear appropriate for outpatient follow-up. [PR]    Clinical Course User Index [PR] Merlyn Lot, MD    FINAL CLINICAL IMPRESSION(S) / ED DIAGNOSES   Final diagnoses:  Uncontrolled hypertension     Rx / DC Orders   ED Discharge Orders          Ordered    amLODipine (NORVASC) 5 MG tablet  Daily        06/26/22 1159             Note:  This document was prepared using Dragon voice recognition software and may include unintentional dictation errors.    Merlyn Lot, MD 06/26/22 1159

## 2022-12-29 ENCOUNTER — Emergency Department: Payer: BC Managed Care – PPO

## 2022-12-29 ENCOUNTER — Other Ambulatory Visit: Payer: Self-pay

## 2022-12-29 ENCOUNTER — Emergency Department
Admission: EM | Admit: 2022-12-29 | Discharge: 2022-12-29 | Disposition: A | Discharge status: Home / Self Care | Payer: BC Managed Care – PPO | Source: Home / Self Care | Attending: Emergency Medicine | Admitting: Emergency Medicine

## 2022-12-29 DIAGNOSIS — R1032 Left lower quadrant pain: Secondary | ICD-10-CM | POA: Diagnosis not present

## 2022-12-29 DIAGNOSIS — N10 Acute tubulo-interstitial nephritis: Secondary | ICD-10-CM | POA: Insufficient documentation

## 2022-12-29 DIAGNOSIS — Z1152 Encounter for screening for COVID-19: Secondary | ICD-10-CM | POA: Insufficient documentation

## 2022-12-29 DIAGNOSIS — R509 Fever, unspecified: Secondary | ICD-10-CM | POA: Diagnosis present

## 2022-12-29 LAB — URINALYSIS, ROUTINE W REFLEX MICROSCOPIC
Bilirubin Urine: NEGATIVE
Glucose, UA: NEGATIVE mg/dL
Ketones, ur: 5 mg/dL — AB
Nitrite: NEGATIVE
Protein, ur: 100 mg/dL — AB
Specific Gravity, Urine: 1.01 (ref 1.005–1.030)
pH: 5 (ref 5.0–8.0)

## 2022-12-29 LAB — CBC WITH DIFFERENTIAL/PLATELET
Abs Immature Granulocytes: 0.13 10*3/uL — ABNORMAL HIGH (ref 0.00–0.07)
Basophils Absolute: 0.1 10*3/uL (ref 0.0–0.1)
Basophils Relative: 0 %
Eosinophils Absolute: 0 10*3/uL (ref 0.0–0.5)
Eosinophils Relative: 0 %
HCT: 39.7 % (ref 36.0–46.0)
Hemoglobin: 13.7 g/dL (ref 12.0–15.0)
Immature Granulocytes: 1 %
Lymphocytes Relative: 12 %
Lymphs Abs: 2.4 10*3/uL (ref 0.7–4.0)
MCH: 29.5 pg (ref 26.0–34.0)
MCHC: 34.5 g/dL (ref 30.0–36.0)
MCV: 85.6 fL (ref 80.0–100.0)
Monocytes Absolute: 2.2 10*3/uL — ABNORMAL HIGH (ref 0.1–1.0)
Monocytes Relative: 11 %
Neutro Abs: 14.9 10*3/uL — ABNORMAL HIGH (ref 1.7–7.7)
Neutrophils Relative %: 76 %
Platelets: 228 10*3/uL (ref 150–400)
RBC: 4.64 MIL/uL (ref 3.87–5.11)
RDW: 14.4 % (ref 11.5–15.5)
WBC: 19.7 10*3/uL — ABNORMAL HIGH (ref 4.0–10.5)
nRBC: 0 % (ref 0.0–0.2)

## 2022-12-29 LAB — COMPREHENSIVE METABOLIC PANEL
ALT: 17 U/L (ref 0–44)
AST: 18 U/L (ref 15–41)
Albumin: 3.7 g/dL (ref 3.5–5.0)
Alkaline Phosphatase: 65 U/L (ref 38–126)
Anion gap: 12 (ref 5–15)
BUN: 12 mg/dL (ref 6–20)
CO2: 21 mmol/L — ABNORMAL LOW (ref 22–32)
Calcium: 8.6 mg/dL — ABNORMAL LOW (ref 8.9–10.3)
Chloride: 98 mmol/L (ref 98–111)
Creatinine, Ser: 0.86 mg/dL (ref 0.44–1.00)
GFR, Estimated: >60 mL/min (ref >60)
Glucose, Bld: 131 mg/dL — ABNORMAL HIGH (ref 70–99)
Potassium: 3.4 mmol/L — ABNORMAL LOW (ref 3.5–5.1)
Sodium: 131 mmol/L — ABNORMAL LOW (ref 135–145)
Total Bilirubin: 2 mg/dL — ABNORMAL HIGH (ref 0.3–1.2)
Total Protein: 7.5 g/dL (ref 6.5–8.1)

## 2022-12-29 LAB — RESP PANEL BY RT-PCR (RSV, FLU A&B, COVID)  RVPGX2
Influenza A by PCR: NEGATIVE
Influenza B by PCR: NEGATIVE
Resp Syncytial Virus by PCR: NEGATIVE
SARS Coronavirus 2 by RT PCR: NEGATIVE

## 2022-12-29 LAB — LACTIC ACID, PLASMA: Lactic Acid, Venous: 1 mmol/L (ref 0.5–1.9)

## 2022-12-29 MED ORDER — IOHEXOL 300 MG/ML  SOLN
100.0000 mL | Freq: Once | INTRAMUSCULAR | Status: AC | PRN
  Administered 2022-12-29: 100 mL via INTRAVENOUS

## 2022-12-29 MED ORDER — ONDANSETRON 8 MG PO TBDP: 8.0000 mg | ORAL_TABLET | Freq: Three times a day (TID) | ORAL | 0 refills | Status: DC | PRN

## 2022-12-29 MED ORDER — KETOROLAC TROMETHAMINE 30 MG/ML IJ SOLN
15.0000 mg | Freq: Once | INTRAMUSCULAR | Status: AC
  Administered 2022-12-29: 15 mg via INTRAVENOUS
  Filled 2022-12-29: qty 1

## 2022-12-29 MED ORDER — LACTATED RINGERS IV SOLN: INTRAVENOUS | Status: DC

## 2022-12-29 MED ORDER — ACETAMINOPHEN 325 MG PO TABS
650.0000 mg | ORAL_TABLET | Freq: Once | ORAL | Status: AC
  Administered 2022-12-29: 650 mg via ORAL
  Filled 2022-12-29: qty 2

## 2022-12-29 MED ORDER — SODIUM CHLORIDE 0.9 % IV BOLUS
1000.0000 mL | Freq: Once | INTRAVENOUS | Status: AC
  Administered 2022-12-29: 1000 mL via INTRAVENOUS

## 2022-12-29 MED ORDER — CEFDINIR 300 MG PO CAPS: 300.0000 mg | ORAL_CAPSULE | Freq: Two times a day (BID) | ORAL | 0 refills | Status: AC

## 2022-12-29 MED ORDER — SODIUM CHLORIDE 0.9 % IV BOLUS (SEPSIS)
1000.0000 mL | Freq: Once | INTRAVENOUS | Status: AC
  Administered 2022-12-29: 1000 mL via INTRAVENOUS

## 2022-12-29 MED ORDER — KETOROLAC TROMETHAMINE 10 MG PO TABS: 10.0000 mg | ORAL_TABLET | Freq: Four times a day (QID) | ORAL | 0 refills | Status: DC | PRN

## 2022-12-29 MED ORDER — SODIUM CHLORIDE 0.9 % IV SOLN
2.0000 g | INTRAVENOUS | Status: DC
  Administered 2022-12-29: 2 g via INTRAVENOUS
  Filled 2022-12-29: qty 20

## 2022-12-29 NOTE — ED Provider Notes (Signed)
[facility] Provider Note   Event Date/Time   First MD Initiated Contact with Patient 12/29/22 1733     (approximate) History  Fever  HPI Ana Cannon is a 52 y.o. female with a stated past medical history of pyelonephritis who presents complaining of left flank pain, fever, and dysuria over the past week.  Patient states that the symptoms are similar to pyelonephritis that she has had in the past.  Patient endorses nausea without vomiting.  Patient endorses 9/10, left flank nonradiating abdominal pain that has no exacerbating or relieving symptoms. ROS: Patient currently denies any vision changes, tinnitus, difficulty speaking, facial droop, sore throat, chest pain, shortness of breath, vomiting/diarrhea, dysuria, or weakness/numbness/paresthesias in any extremity   Physical Exam  Triage Vital Signs: ED Triage Vitals  Encounter Vitals Group     BP 12/29/22 1606 (!) 148/96     Systolic BP Percentile --      Diastolic BP Percentile --      Pulse Rate 12/29/22 1606 68     Resp 12/29/22 1606 17     Temp 12/29/22 1606 (!) 103.1 F (39.5 C)     Temp Source 12/29/22 1606 Oral     SpO2 12/29/22 1606 97 %     Weight 12/29/22 1607 130 lb (59 kg)     Height 12/29/22 1607 5\' 7"  (1.702 m)     Head Circumference --      Peak Flow --      Pain Score 12/29/22 1607 0     Pain Loc --      Pain Education --      Exclude from Growth Chart --    Most recent vital signs: Vitals:   12/29/22 2030 12/29/22 2204  BP: (!) 128/90 (!) 132/92  Pulse: 87 80  Resp:  20  Temp:  98.7 F (37.1 C)  SpO2: 96% 99%   General: Awake, oriented x4. CV:  Good peripheral perfusion.  Resp:  Normal effort.  Abd:  No distention.  Other:  Middle-aged well-developed, well-nourished African-American female resting comfortably in no acute distress ED Results / Procedures / Treatments  Labs (all labs ordered are listed, but only abnormal results are displayed) Labs Reviewed  CBC WITH  DIFFERENTIAL/PLATELET - Abnormal; Notable for the following components:      Result Value   WBC 19.7 (*)    Neutro Abs 14.9 (*)    Monocytes Absolute 2.2 (*)    Abs Immature Granulocytes 0.13 (*)    All other components within normal limits  COMPREHENSIVE METABOLIC PANEL - Abnormal; Notable for the following components:   Sodium 131 (*)    Potassium 3.4 (*)    CO2 21 (*)    Glucose, Bld 131 (*)    Calcium 8.6 (*)    Total Bilirubin 2.0 (*)    All other components within normal limits  URINALYSIS, ROUTINE W REFLEX MICROSCOPIC - Abnormal; Notable for the following components:   Color, Urine YELLOW (*)    APPearance HAZY (*)    Hgb urine dipstick MODERATE (*)    Ketones, ur 5 (*)    Protein, ur 100 (*)    Leukocytes,Ua SMALL (*)    Bacteria, UA RARE (*)    All other components within normal limits  RESP PANEL BY RT-PCR (RSV, FLU A&B, COVID)  RVPGX2  CULTURE, BLOOD (ROUTINE X 2)  CULTURE, BLOOD (ROUTINE X 2)  LACTIC ACID, PLASMA  LACTIC ACID, PLASMA   RADIOLOGY ED MD interpretation:  CT of the abdomen and pelvis with IV contrast interpreted independently by me and shows perinephric fluid and multiple striated nephrograms consistent with acute pyelonephritis -Agree with radiology assessment Official radiology report(s): CT ABDOMEN PELVIS W CONTRAST  Result Date: 12/29/2022 CLINICAL DATA:  Septicemia.  Concern for kidney infection. EXAM: CT ABDOMEN AND PELVIS WITH CONTRAST TECHNIQUE: Multidetector CT imaging of the abdomen and pelvis was performed using the standard protocol following bolus administration of intravenous contrast. RADIATION DOSE REDUCTION: This exam was performed according to the departmental dose-optimization program which includes automated exposure control, adjustment of the mA and/or kV according to patient size and/or use of iterative reconstruction technique. CONTRAST:  OMNIPAQUE IOHEXOL 300 MG/ML  SOLN COMPARISON:  None Available. FINDINGS: Lower chest:  Scarring noted within the periphery of the right base. No pleural effusion or airspace consolidation. Hepatobiliary: No suspicious liver abnormality. There is a stone within the gallbladder measuring 5 mm. No significant gallbladder wall thickening or surrounding inflammation. No bile duct dilatation. Pancreas: Unremarkable. No pancreatic ductal dilatation or surrounding inflammatory changes. Spleen: Small irregular appearing spleen with multiple splenules identified in the left upper quadrant. Findings are suggestive of sequelae of remote trauma. Adrenals/Urinary Tract: Normal adrenal glands. Pelvicoliectasis, perinephric fluid and multiple striated nephrograms involving the left kidney. No mass or hydronephrosis identified. Right kidney is unremarkable. Diffuse circumferential wall thickening with mucosal enhancement is noted involving the bladder. Stomach/Bowel: Normal appearance of the stomach. No pathologic dilatation of the large or small bowel loops. Moderate stool burden is identified within the colon. The appendix is suboptimally visualized. No secondary signs of acute appendicitis noted Vascular/Lymphatic: Normal appearance of the abdominal aorta. No signs of abdominopelvic adenopathy. Reproductive: Uterus and bilateral adnexa are unremarkable. Other: Trace free fluid noted within the cul-de-sac. Musculoskeletal: No acute or significant osseous findings. IMPRESSION: 1. Pelvicoliectasis, perinephric fluid and multiple striated nephrograms involving the left kidney. Findings are compatible with acute pyelonephritis. 2. Diffuse circumferential wall thickening with mucosal enhancement involving the bladder is compatible with cystitis. 3. Cholelithiasis. 4. Trace free fluid within the cul-de-sac . Electronically Signed   By: Signa Kell M.D.   On: 12/29/2022 19:10   PROCEDURES: Critical Care performed: No .1-3 Lead EKG Interpretation  Performed by: Merwyn Katos, MD Authorized by: Merwyn Katos, MD      Interpretation: normal     ECG rate:  71   ECG rate assessment: normal     Rhythm: sinus rhythm     Ectopy: none     Conduction: normal    MEDICATIONS ORDERED IN ED: Medications  lactated ringers infusion (0 mLs Intravenous Hold 12/29/22 2000)  cefTRIAXone (ROCEPHIN) 2 g in sodium chloride 0.9 % 100 mL IVPB (0 g Intravenous Stopped 12/29/22 2036)  acetaminophen (TYLENOL) tablet 650 mg (650 mg Oral Given 12/29/22 1613)  sodium chloride 0.9 % bolus 1,000 mL (0 mLs Intravenous Stopped 12/29/22 2204)  iohexol (OMNIPAQUE) 300 MG/ML solution 100 mL (100 mLs Intravenous Contrast Given 12/29/22 1839)  sodium chloride 0.9 % bolus 1,000 mL (0 mLs Intravenous Stopped 12/29/22 2106)  ketorolac (TORADOL) 30 MG/ML injection 15 mg (15 mg Intravenous Given 12/29/22 2007)   IMPRESSION / MDM / ASSESSMENT AND PLAN / ED COURSE  I reviewed the triage vital signs and the nursing notes.                             The patient is on the cardiac monitor to evaluate for evidence  of arrhythmia and/or significant heart rate changes. Patient's presentation is most consistent with acute presentation with potential threat to life or bodily function. 52 year old female with above-stated past medical history presents for left flank pain with dysuria and fever Not Pregnant. Unlikely TOA, Ovarian Torsion, PID, gonorrhea/chlamydia. Low suspicion for Infected Urolithiasis, AAA, Cholecystitis, Pancreatitis, SBO, Appendicitis, or other acute abdomen. CT of the abdomen pelvis does show evidence of pyelonephritis however patient has no lactic acidosis, no hypotension, no tachycardia, and fever well-controlled with Tylenol.  I offered admission to the this patient however she agrees with plan for discharge home on oral antibiotics and follow-up with her primary care physician within the next week for resolution of symptoms. Rx: Cefdinir 300 mg BID for 10 days Disposition: Discharge home. SRP discussed. Advise follow up with primary  care provider within 24-72 hours.   FINAL CLINICAL IMPRESSION(S) / ED DIAGNOSES   Final diagnoses:  Acute pyelonephritis   Rx / DC Orders   ED Discharge Orders          Ordered    cefdinir (OMNICEF) 300 MG capsule  2 times daily        12/29/22 1942    ketorolac (TORADOL) 10 MG tablet  Every 6 hours PRN       Note to Pharmacy: Patient given an IM/IV loading dose in emergency department   12/29/22 1942    ondansetron (ZOFRAN-ODT) 8 MG disintegrating tablet  Every 8 hours PRN        12/29/22 1942           Note:  This document was prepared using Dragon voice recognition software and may include unintentional dictation errors.   Merwyn Katos, MD 12/29/22 2232

## 2022-12-29 NOTE — Sepsis Progress Note (Signed)
Elink monitoring for the code sepsis protocol.  

## 2022-12-29 NOTE — Progress Notes (Signed)
CODE SEPSIS - PHARMACY COMMUNICATION  **Broad Spectrum Antibiotics should be administered within 1 hour of Sepsis diagnosis**  Time Code Sepsis Called/Page Received: 1932  Antibiotics Ordered: Ceftriaxone  Time of 1st antibiotic administration: 2006   Netta Neat ,PharmD Clinical Pharmacist  12/29/2022  7:50 PM

## 2022-12-29 NOTE — ED Triage Notes (Signed)
Pt presents to ED with c/o of possible "kidney infection". Pt states HX of same. Pt is afebrile in triage. Pt states she started to feel sick this past Friday.

## 2022-12-29 NOTE — ED Notes (Signed)
Pt A&O x4, no obvious distress noted, respirations regular/unlabored. Pt verbalizes understanding of discharge instructions. Pt able to ambulate from ED independently.   

## 2023-02-24 ENCOUNTER — Emergency Department: Payer: BC Managed Care – PPO

## 2023-02-24 ENCOUNTER — Other Ambulatory Visit: Payer: Self-pay

## 2023-02-24 ENCOUNTER — Emergency Department
Admission: EM | Admit: 2023-02-24 | Discharge: 2023-02-24 | Disposition: A | Discharge status: Home / Self Care | Payer: BC Managed Care – PPO | Attending: Emergency Medicine | Admitting: Emergency Medicine

## 2023-02-24 DIAGNOSIS — W231XXA Caught, crushed, jammed, or pinched between stationary objects, initial encounter: Secondary | ICD-10-CM | POA: Diagnosis not present

## 2023-02-24 DIAGNOSIS — S6710XA Crushing injury of unspecified finger(s), initial encounter: Secondary | ICD-10-CM | POA: Diagnosis present

## 2023-02-24 DIAGNOSIS — S62662B Nondisplaced fracture of distal phalanx of right middle finger, initial encounter for open fracture: Secondary | ICD-10-CM | POA: Diagnosis not present

## 2023-02-24 MED ORDER — LIDOCAINE HCL (PF) 1 % IJ SOLN
5.0000 mL | Freq: Once | INTRAMUSCULAR | Status: AC
  Administered 2023-02-24: 5 mL via INTRADERMAL
  Filled 2023-02-24: qty 5

## 2023-02-24 MED ORDER — OXYCODONE-ACETAMINOPHEN 5-325 MG PO TABS
1.0000 | ORAL_TABLET | Freq: Once | ORAL | Status: AC
  Administered 2023-02-24: 1 via ORAL
  Filled 2023-02-24: qty 1

## 2023-02-24 MED ORDER — CEPHALEXIN 500 MG PO CAPS: 500.0000 mg | ORAL_CAPSULE | Freq: Four times a day (QID) | ORAL | 0 refills | Status: AC

## 2023-02-24 NOTE — ED Provider Notes (Signed)
[facility] Provider Note    Event Date/Time   First MD Initiated Contact with Patient 02/24/23 1811     (approximate)   History   Hand Injury   HPI  Ana Cannon is a 52 y.o. female who presents for evaluation after her right middle finger was slammed in a car door earlier today.  She reports pain to the area.  She also says it is slightly numb.  Tetanus shot is up-to-date.      Physical Exam   Triage Vital Signs: ED Triage Vitals  Encounter Vitals Group     BP 02/24/23 1618 (!) 147/100     Systolic BP Percentile --      Diastolic BP Percentile --      Pulse Rate 02/24/23 1618 80     Resp 02/24/23 1618 17     Temp 02/24/23 1618 98.2 F (36.8 C)     Temp Source 02/24/23 1618 Oral     SpO2 02/24/23 1618 100 %     Weight --      Height --      Head Circumference --      Peak Flow --      Pain Score 02/24/23 1619 8     Pain Loc --      Pain Education --      Exclude from Growth Chart --     Most recent vital signs: Vitals:   02/24/23 1618  BP: (!) 147/100  Pulse: 80  Resp: 17  Temp: 98.2 F (36.8 C)  SpO2: 100%    General: Awake, no distress.  CV:  Good peripheral perfusion.  Resp:  Normal effort.  Abd:  No distention.  Other:  Laceration to the distal palmar side of the right middle finger and the cuticle.  Bleeding is controlled.  Sensation intact distal to the wound, capillary refill is appropriate, nailbed intact but there is some overlying bruising of the nail.  Patient able to fully flex and extend the finger.   ED Results / Procedures / Treatments   Labs (all labs ordered are listed, but only abnormal results are displayed) Labs Reviewed - No data to display  RADIOLOGY  Right middle finger x-rays obtained, interpreted the images as well as reviewed the radiologist report which shows an acute mildly comminuted fracture of the distal part of the distal phalanx.  PROCEDURES:  Critical Care performed:  No  ..Laceration Repair  Date/Time: 02/24/2023 8:23 PM  Performed by: Cameron Ali, PA-C Authorized by: Cameron Ali, PA-C   Consent:    Consent obtained:  Verbal   Consent given by:  Patient   Risks, benefits, and alternatives were discussed: yes     Risks discussed:  Infection and pain   Alternatives discussed:  No treatment Universal protocol:    Patient identity confirmed:  Verbally with patient Anesthesia:    Anesthesia method:  Nerve block   Block location:  Base of right middle finger   Block needle gauge:  27 G   Block anesthetic:  Lidocaine 1% w/o epi   Block technique:  Flexor tendon sheath   Block injection procedure:  Anatomic landmarks identified, introduced needle and incremental injection   Block outcome:  Anesthesia achieved Laceration details:    Location:  Finger   Finger location:  R long finger   Length (cm):  2   Depth (mm):  3 Pre-procedure details:    Preparation:  Patient was prepped and draped in usual  sterile fashion and imaging obtained to evaluate for foreign bodies Exploration:    Hemostasis achieved with:  Direct pressure   Imaging obtained: x-ray     Imaging outcome: foreign body not noted     Wound exploration: entire depth of wound visualized   Treatment:    Area cleansed with:  Povidone-iodine   Amount of cleaning:  Standard   Irrigation solution:  Sterile saline   Irrigation volume:  50 mL   Irrigation method:  Syringe Skin repair:    Repair method:  Sutures   Suture size:  4-0   Suture material:  Nylon   Suture technique:  Simple interrupted   Number of sutures:  3 Approximation:    Approximation:  Close Repair type:    Repair type:  Simple Post-procedure details:    Dressing:  Splint for protection, tube gauze and bulky dressing   Procedure completion:  Tolerated well, no immediate complications .Marland KitchenLaceration Repair  Date/Time: 02/24/2023 8:24 PM  Performed by: Cameron Ali, PA-C Authorized by:  Cameron Ali, PA-C   Consent:    Consent obtained:  Verbal   Consent given by:  Patient   Risks, benefits, and alternatives were discussed: yes     Risks discussed:  Infection and pain   Alternatives discussed:  No treatment Universal protocol:    Patient identity confirmed:  Verbally with patient Anesthesia:    Anesthesia method:  Nerve block   Block location:  Base of right middle finger   Block needle gauge:  27 G   Block anesthetic:  Lidocaine 1% w/o epi   Block technique:  Flexor tendon sheath   Block injection procedure:  Anatomic landmarks identified, introduced needle and incremental injection   Block outcome:  Anesthesia achieved Laceration details:    Location:  Finger   Finger location:  R long finger   Length (cm):  0.5   Depth (mm):  1 Pre-procedure details:    Preparation:  Patient was prepped and draped in usual sterile fashion and imaging obtained to evaluate for foreign bodies Exploration:    Hemostasis achieved with:  Direct pressure   Imaging obtained: x-ray     Imaging outcome: foreign body not noted     Wound exploration: entire depth of wound visualized   Treatment:    Area cleansed with:  Povidone-iodine   Amount of cleaning:  Standard   Irrigation solution:  Sterile saline   Irrigation volume:  30 mL   Irrigation method:  Syringe Skin repair:    Repair method:  Sutures   Suture size:  4-0   Suture material:  Nylon   Suture technique:  Simple interrupted   Number of sutures:  1 Approximation:    Approximation:  Loose Repair type:    Repair type:  Simple Post-procedure details:    Dressing:  Bulky dressing, splint for protection and tube gauze   Procedure completion:  Tolerated well, no immediate complications    MEDICATIONS ORDERED IN ED: Medications  lidocaine (PF) (XYLOCAINE) 1 % injection 5 mL (5 mLs Intradermal Given 02/24/23 1937)  oxyCODONE-acetaminophen (PERCOCET/ROXICET) 5-325 MG per tablet 1 tablet (1 tablet Oral Given  02/24/23 1952)     IMPRESSION / MDM / ASSESSMENT AND PLAN / ED COURSE  I reviewed the triage vital signs and the nursing notes.                             52 year old female presents for evaluation  after her right middle finger was slammed in a car door.  Patient was hypertensive in triage but otherwise vital signs were stable.  Patient verbalizes being in pain on exam.  Differential diagnosis includes, but is not limited to, laceration, nail injury, fracture, tendon injury, ligament injury, nerve injury.  Patient's presentation is most consistent with acute complicated illness / injury requiring diagnostic workup.  Right middle finger x-rays obtained, interpreted the images as well as reviewed the radiologist report which shows a mildly comminuted fracture of the distal part of the distal phalanx.  Laceration repaired as described in the procedure note above.  Patient's finger was placed in a splint.  She will be given oral antibiotics as this is an open fracture.  Patient advised to follow-up with orthopedics.  She voiced understanding, all questions were answered and she was stable at discharge.      FINAL CLINICAL IMPRESSION(S) / ED DIAGNOSES   Final diagnoses:  Crushing injury of finger, initial encounter  Open nondisplaced fracture of distal phalanx of right middle finger, initial encounter     Rx / DC Orders   ED Discharge Orders          Ordered    cephALEXin (KEFLEX) 500 MG capsule  4 times daily        02/24/23 2017             Note:  This document was prepared using Dragon voice recognition software and may include unintentional dictation errors.   Cameron Ali, PA-C 02/24/23 2026    Phineas Semen, MD 02/24/23 2226

## 2023-02-24 NOTE — Discharge Instructions (Addendum)
Please take the antibiotics as prescribed.  You can take 650 mg of Tylenol and 600 mg of ibuprofen every 6 hours as needed for pain.  Please keep the splint clean and dry.  Follow-up with orthopedics, their office to schedule an appointment.  Stitches will need to be removed in 7 to 10 days.

## 2023-02-24 NOTE — ED Notes (Signed)
Patient discharged at this time. Ambulated to lobby with independent and steady gait. Breathing unlabored speaking in full sentences. Verbalized understanding of all discharge, follow up, and medication teaching. Discharged homed with all belongings.   

## 2023-02-24 NOTE — ED Triage Notes (Signed)
Pt presents to ED with c/o of R middle finger, bleeding controlled. Pt states finger was slammed in door.

## 2023-06-28 IMAGING — MR MR LUMBAR SPINE W/O CM
5 series · 31 of 48 positions shown · non-contrast
Comparison: None.

CLINICAL DATA: Low back pain.

EXAM:
MRI LUMBAR SPINE WITHOUT CONTRAST
TECHNIQUE: Multiplanar, multisequence MR imaging of the lumbar spine was
performed. No intravenous contrast was administered.

[Series 5: T2 · sagittal · 4.0mm · 0.81mm/px · 6 of 15 slices shown (1 of 2)]
[im 1/15]
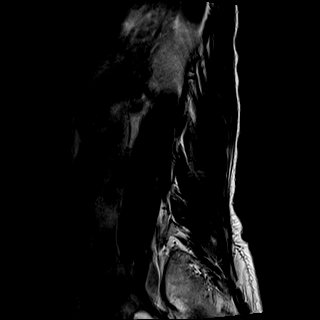
[im 3/15]
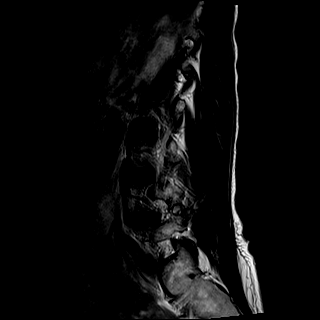
[im 6/15]
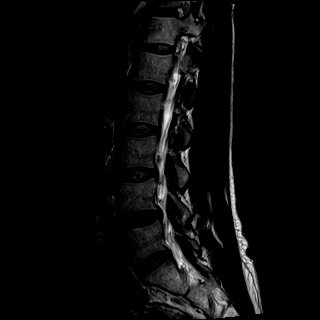
[im 9/15]
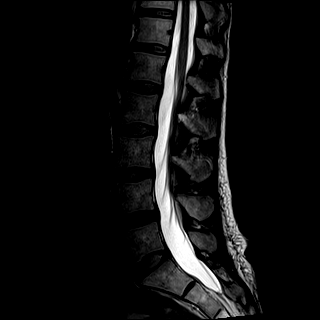
[im 12/15]
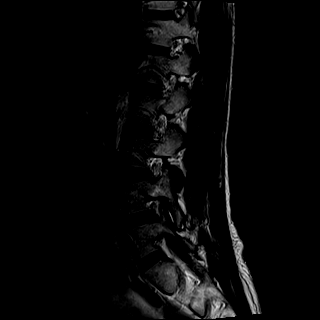
[im 15/15]
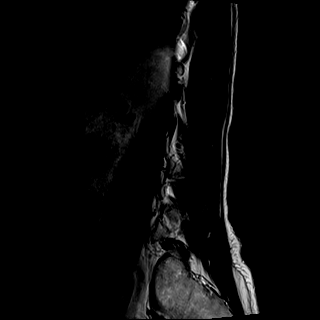

[Series 6: T1 · sagittal · 4.0mm · 0.81mm/px · 6 of 15 slices shown (1 of 2)]
[im 1/15]
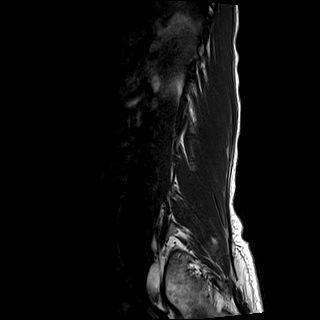
[im 3/15]
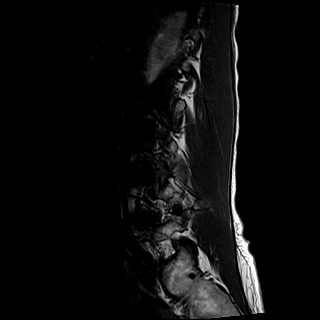
[im 6/15]
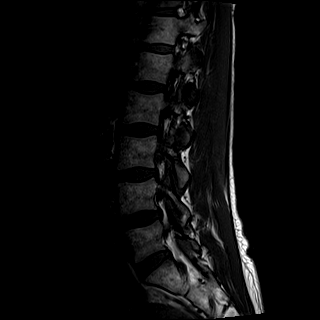
[im 9/15]
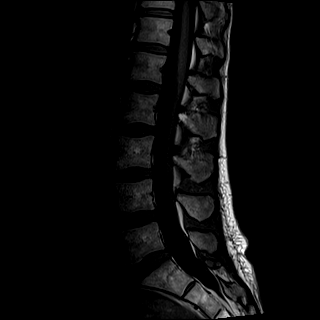
[im 12/15]
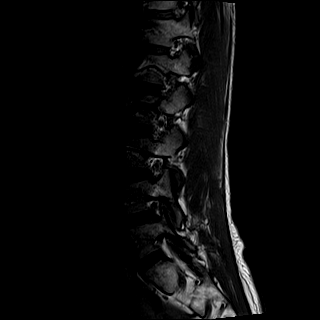
[im 15/15]
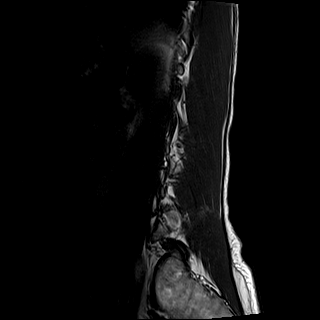

[Series 7: STIR · sagittal · 4.0mm · 0.41mm/px · 1 of 15 slices shown]
[im 1/15]
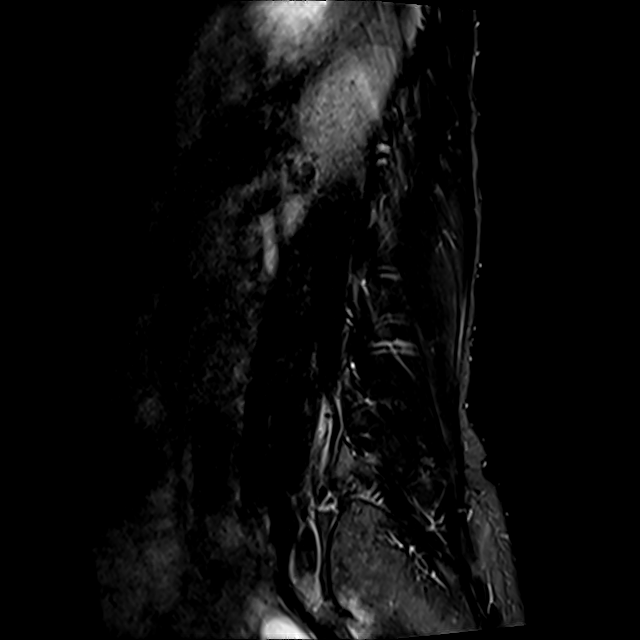

[Series 8: T2 · axial · 4.0mm · 0.78mm/px · z∈[-40,+174]mm · 9 of 36 slices shown (2 of 2)]
[im 1/36]
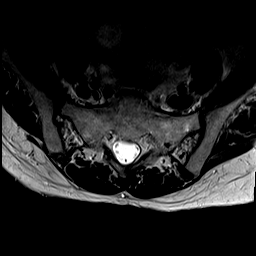
[im 6/36]
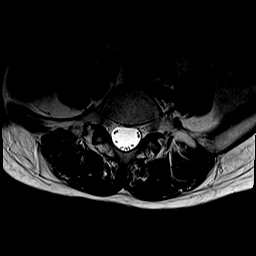
[im 11/36]
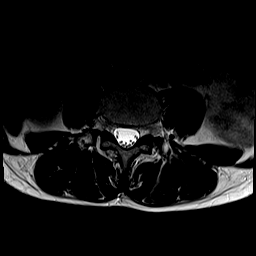
[im 16/36]
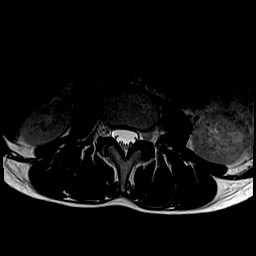
[im 18/36]
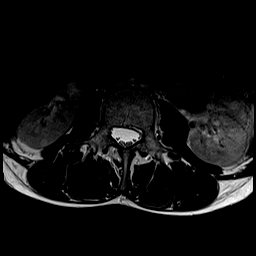
[im 21/36]
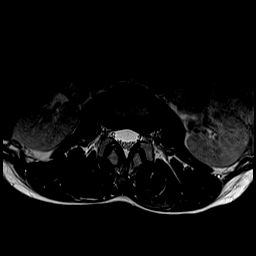
[im 26/36]
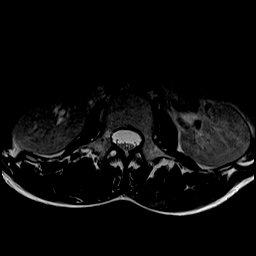
[im 31/36]
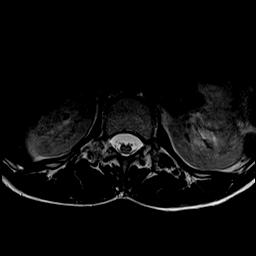
[im 36/36]
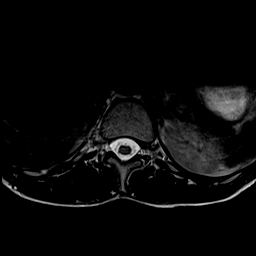

[Series 9: T1 · axial · 4.0mm · 0.39mm/px · z∈[-40,+174]mm · 9 of 36 slices shown (2 of 2)]
[im 1/36]
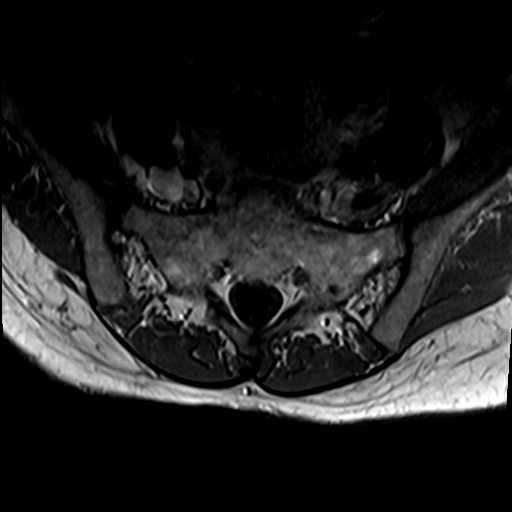
[im 6/36]
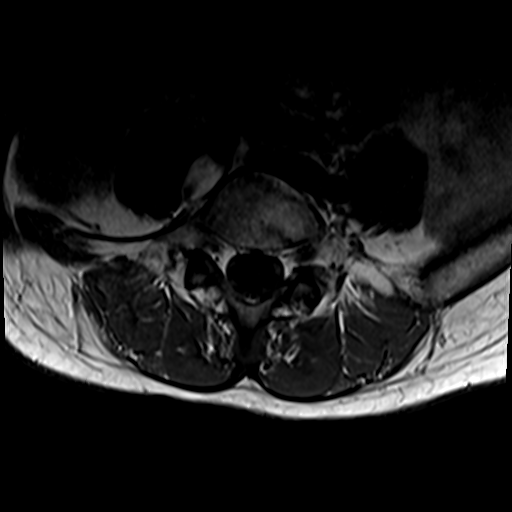
[im 11/36]
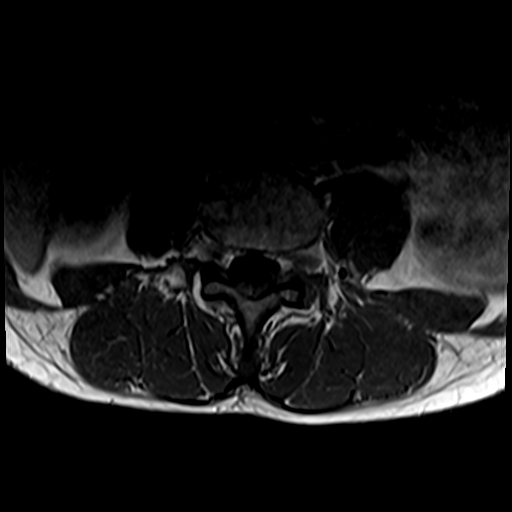
[im 16/36]
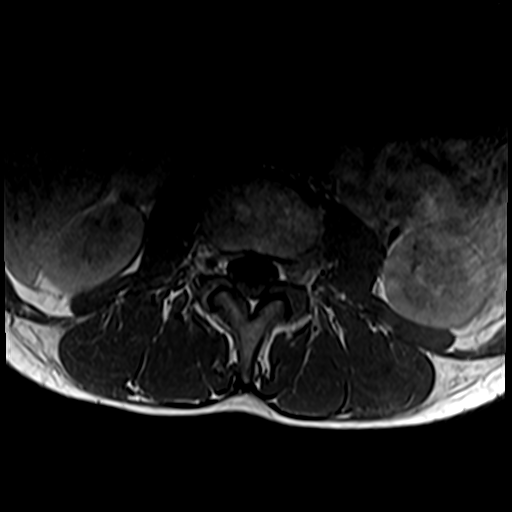
[im 18/36]
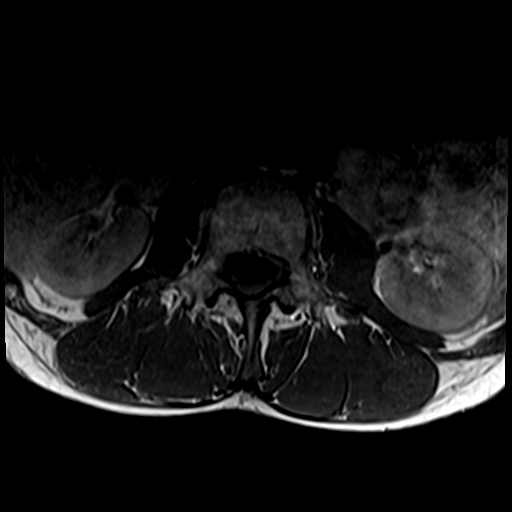
[im 21/36]
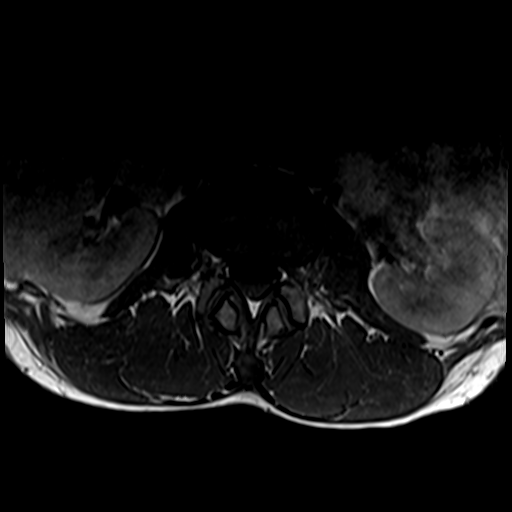
[im 26/36]
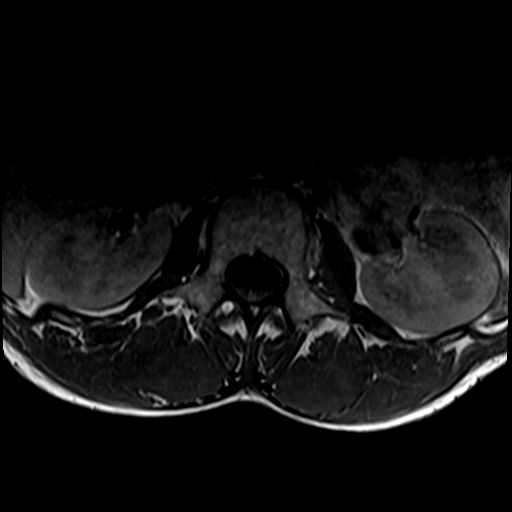
[im 31/36]
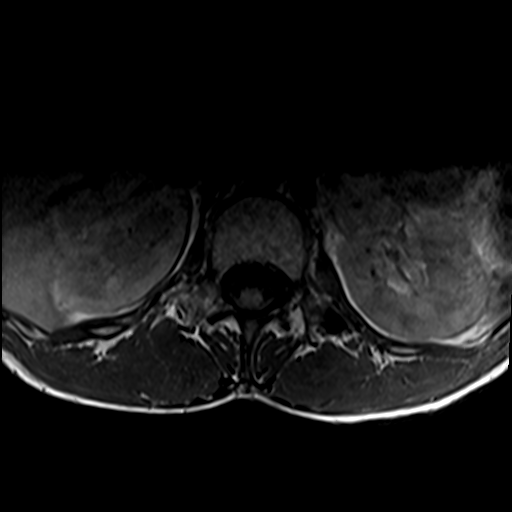
[im 36/36]
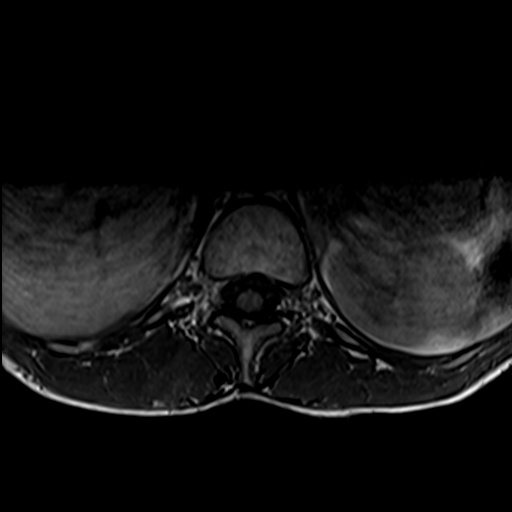

[31 of 48 positions shown; findings below may reference images not displayed]

FINDINGS: Segmentation:  Standard.

Alignment:  Physiologic.

Vertebrae: No acute fracture, evidence of discitis, or aggressive
bone lesion.

Conus medullaris and cauda equina: Conus extends to the L1 level.
Conus and cauda equina appear normal.

Paraspinal and other soft tissues: No acute paraspinal abnormality.

Disc levels:

Disc spaces: Disc spaces are preserved.

T12-L1: No significant disc bulge. No neural foraminal stenosis. No
central canal stenosis.

L1-L2: No significant disc bulge. No neural foraminal stenosis. No
central canal stenosis.

L2-L3: No significant disc bulge. No neural foraminal stenosis. No
central canal stenosis.

L3-L4: Small left foraminal disc protrusion. No foraminal or central
canal stenosis.

L4-L5: Mild broad-based disc bulge with a left foraminal disc
protrusion contacting the left L4 nerve root. Mild bilateral
foraminal narrowing. No spinal stenosis. Mild bilateral facet
extra-spinal arthropathy with a 10 mm left facet synovial cyst.

L5-S1: No significant disc bulge. No neural foraminal stenosis. No
central canal stenosis.
IMPRESSION: 1. At L4-5 there is a mild broad-based disc bulge with a left
foraminal disc protrusion contacting the left L4 nerve root. Mild
bilateral foraminal narrowing. No spinal stenosis. Mild bilateral
facet extra-spinal arthropathy with a 10 mm left facet synovial
cyst.
2. At L3-4 there is a small left foraminal disc protrusion. No
foraminal or central canal stenosis.
3.  No acute osseous injury of the lumbar spine.

## 2023-07-08 ENCOUNTER — Inpatient Hospital Stay
Admission: EM | Admit: 2023-07-08 | Discharge: 2023-07-11 | DRG: 871 | Disposition: A | Discharge status: Home / Self Care | Payer: BC Managed Care – PPO | Attending: Internal Medicine | Admitting: Internal Medicine

## 2023-07-08 ENCOUNTER — Emergency Department: Payer: BC Managed Care – PPO

## 2023-07-08 ENCOUNTER — Other Ambulatory Visit: Payer: Self-pay

## 2023-07-08 DIAGNOSIS — A419 Sepsis, unspecified organism: Principal | ICD-10-CM

## 2023-07-08 DIAGNOSIS — Z1152 Encounter for screening for COVID-19: Secondary | ICD-10-CM

## 2023-07-08 DIAGNOSIS — F17210 Nicotine dependence, cigarettes, uncomplicated: Secondary | ICD-10-CM | POA: Diagnosis present

## 2023-07-08 DIAGNOSIS — F419 Anxiety disorder, unspecified: Secondary | ICD-10-CM

## 2023-07-08 DIAGNOSIS — I10 Essential (primary) hypertension: Secondary | ICD-10-CM | POA: Diagnosis present

## 2023-07-08 DIAGNOSIS — Z8249 Family history of ischemic heart disease and other diseases of the circulatory system: Secondary | ICD-10-CM | POA: Diagnosis not present

## 2023-07-08 DIAGNOSIS — J189 Pneumonia, unspecified organism: Principal | ICD-10-CM

## 2023-07-08 DIAGNOSIS — E785 Hyperlipidemia, unspecified: Secondary | ICD-10-CM | POA: Diagnosis present

## 2023-07-08 DIAGNOSIS — E876 Hypokalemia: Secondary | ICD-10-CM | POA: Diagnosis present

## 2023-07-08 DIAGNOSIS — R112 Nausea with vomiting, unspecified: Secondary | ICD-10-CM | POA: Diagnosis present

## 2023-07-08 DIAGNOSIS — K219 Gastro-esophageal reflux disease without esophagitis: Secondary | ICD-10-CM

## 2023-07-08 DIAGNOSIS — F32A Depression, unspecified: Secondary | ICD-10-CM | POA: Diagnosis present

## 2023-07-08 DIAGNOSIS — Z79899 Other long term (current) drug therapy: Secondary | ICD-10-CM | POA: Diagnosis not present

## 2023-07-08 HISTORY — DX: Sepsis, unspecified organism: A41.9

## 2023-07-08 LAB — CBC
HCT: 35.1 % — ABNORMAL LOW (ref 36.0–46.0)
Hemoglobin: 12.1 g/dL (ref 12.0–15.0)
MCH: 29.8 pg (ref 26.0–34.0)
MCHC: 34.5 g/dL (ref 30.0–36.0)
MCV: 86.5 fL (ref 80.0–100.0)
Platelets: 424 10*3/uL — ABNORMAL HIGH (ref 150–400)
RBC: 4.06 MIL/uL (ref 3.87–5.11)
RDW: 14.1 % (ref 11.5–15.5)
WBC: 23.9 10*3/uL — ABNORMAL HIGH (ref 4.0–10.5)
nRBC: 0 % (ref 0.0–0.2)

## 2023-07-08 LAB — HCG, QUANTITATIVE, PREGNANCY: hCG, Beta Chain, Quant, S: 1 m[IU]/mL (ref <5)

## 2023-07-08 LAB — COMPREHENSIVE METABOLIC PANEL
ALT: 10 U/L (ref 0–44)
AST: 15 U/L (ref 15–41)
Albumin: 4.1 g/dL (ref 3.5–5.0)
Alkaline Phosphatase: 63 U/L (ref 38–126)
Anion gap: 12 (ref 5–15)
BUN: 6 mg/dL (ref 6–20)
CO2: 24 mmol/L (ref 22–32)
Calcium: 9.3 mg/dL (ref 8.9–10.3)
Chloride: 102 mmol/L (ref 98–111)
Creatinine, Ser: 0.64 mg/dL (ref 0.44–1.00)
GFR, Estimated: >60 mL/min (ref >60)
Glucose, Bld: 112 mg/dL — ABNORMAL HIGH (ref 70–99)
Potassium: 3.4 mmol/L — ABNORMAL LOW (ref 3.5–5.1)
Sodium: 138 mmol/L (ref 135–145)
Total Bilirubin: 0.7 mg/dL (ref 0.0–1.2)
Total Protein: 8 g/dL (ref 6.5–8.1)

## 2023-07-08 LAB — LACTIC ACID, PLASMA: Lactic Acid, Venous: 1.4 mmol/L (ref 0.5–1.9)

## 2023-07-08 LAB — RESP PANEL BY RT-PCR (RSV, FLU A&B, COVID)  RVPGX2
Influenza A by PCR: NEGATIVE
Influenza B by PCR: NEGATIVE
Resp Syncytial Virus by PCR: NEGATIVE
SARS Coronavirus 2 by RT PCR: NEGATIVE

## 2023-07-08 MED ORDER — ONDANSETRON HCL 4 MG/2ML IJ SOLN
INTRAMUSCULAR | Status: AC
  Administered 2023-07-08: 4 mg via INTRAVENOUS
  Filled 2023-07-08: qty 2

## 2023-07-08 MED ORDER — SODIUM CHLORIDE 0.9 % IV SOLN
500.0000 mg | Freq: Once | INTRAVENOUS | Status: AC
  Administered 2023-07-08: 500 mg via INTRAVENOUS
  Filled 2023-07-08: qty 5

## 2023-07-08 MED ORDER — IOHEXOL 300 MG/ML  SOLN
100.0000 mL | Freq: Once | INTRAMUSCULAR | Status: AC | PRN
  Administered 2023-07-08: 100 mL via INTRAVENOUS

## 2023-07-08 MED ORDER — SODIUM CHLORIDE 0.9 % IV SOLN
2.0000 g | Freq: Once | INTRAVENOUS | Status: AC
  Administered 2023-07-08: 2 g via INTRAVENOUS
  Filled 2023-07-08: qty 20

## 2023-07-08 MED ORDER — ACETAMINOPHEN 500 MG PO TABS
1000.0000 mg | ORAL_TABLET | Freq: Once | ORAL | Status: AC
  Administered 2023-07-08: 1000 mg via ORAL
  Filled 2023-07-08: qty 2

## 2023-07-08 NOTE — Progress Notes (Signed)
 CODE SEPSIS - PHARMACY COMMUNICATION  **Broad Spectrum Antibiotics should be administered within 1 hour of Sepsis diagnosis**  Time Code Sepsis Called/Page Received: 1940  Antibiotics Ordered: Ceftriaxone & Z-max  Time of 1st antibiotic administration: 1948  Additional action taken by pharmacy: N/A  Tressie Ellis 07/08/2023  7:42 PM

## 2023-07-08 NOTE — ED Notes (Signed)
 Pt offers no complaints except for feeling weak, in general and no appetite. She has no c/o pain specifically.

## 2023-07-08 NOTE — Progress Notes (Signed)
 Elink monitoring for the code sepsis protocol.

## 2023-07-08 NOTE — ED Triage Notes (Signed)
 Pt sts that she was seen on the 14th for fever with generalized fatigue and N/V. Pt sts that she is just not feeling better.

## 2023-07-08 NOTE — ED Provider Notes (Signed)
 [facility] Provider Note    Event Date/Time   First MD Initiated Contact with Patient 07/08/23 1907     (approximate)   History   Fever   HPI  Ana Cannon is a 53 year old female presenting to the emergency department for evaluation of fever and fatigue.  She was seen on 2/14 by her primary care office with URI symptoms.  She was treated with Tamiflu and over-the-counter medicine.  However, she reports that since that time she has continued to have progressive weakness.  Tried to return to work today, but became extremely tired.  Has not had ongoing fevers.  Additionally reports some nausea and vomiting.  No abdominal pain.     Physical Exam   Triage Vital Signs: ED Triage Vitals  Encounter Vitals Group     BP 07/08/23 1757 (!) 144/97     Systolic BP Percentile --      Diastolic BP Percentile --      Pulse Rate 07/08/23 1757 (!) 101     Resp 07/08/23 1757 17     Temp 07/08/23 1757 99.9 F (37.7 C)     Temp Source 07/08/23 1757 Oral     SpO2 07/08/23 1757 95 %     Weight 07/08/23 1758 121 lb (54.9 kg)     Height 07/08/23 1758 5\' 7"  (1.702 m)     Head Circumference --      Peak Flow --      Pain Score 07/08/23 1758 0     Pain Loc --      Pain Education --      Exclude from Growth Chart --     Most recent vital signs: Vitals:   07/08/23 2030 07/08/23 2100  BP: (!) 136/94 (!) 141/93  Pulse: 87 91  Resp:    Temp:  99.7 F (37.6 C)  SpO2: 97% 97%     General: Awake, interactive  CV:  Regular rate, good peripheral perfusion.  Resp:  Unlabored respirations, lungs clear to auscultation Abd:  Nondistended, soft, nontender to palpation Neuro:  Symmetric facial movement, fluid speech   ED Results / Procedures / Treatments   Labs (all labs ordered are listed, but only abnormal results are displayed) Labs Reviewed  CBC - Abnormal; Notable for the following components:      Result Value   WBC 23.9 (*)    HCT 35.1 (*)     Platelets 424 (*)    All other components within normal limits  COMPREHENSIVE METABOLIC PANEL - Abnormal; Notable for the following components:   Potassium 3.4 (*)    Glucose, Bld 112 (*)    All other components within normal limits  RESP PANEL BY RT-PCR (RSV, FLU A&B, COVID)  RVPGX2  CULTURE, BLOOD (ROUTINE X 2)  CULTURE, BLOOD (ROUTINE X 2)  LACTIC ACID, PLASMA  HCG, QUANTITATIVE, PREGNANCY  URINALYSIS, W/ REFLEX TO CULTURE (INFECTION SUSPECTED)     EKG EKG independently reviewed interpreted by myself (ER attending) demonstrates:    RADIOLOGY Imaging independently reviewed and interpreted by myself demonstrates:  CXR without focal consolidation CT chest, abdomen, pelvis notable for multifocal pneumonia as well as possible cystitis  PROCEDURES:  Critical Care performed: Yes, see critical care procedure note(s)  CRITICAL CARE Performed by: Trinna Post   Total critical care time: 32 minutes  Critical care time was exclusive of separately billable procedures and treating other patients.  Critical care was necessary to treat or prevent imminent or life-threatening deterioration.  Critical care was time spent personally by me on the following activities: development of treatment plan with patient and/or surrogate as well as nursing, discussions with consultants, evaluation of patient's response to treatment, examination of patient, obtaining history from patient or surrogate, ordering and performing treatments and interventions, ordering and review of laboratory studies, ordering and review of radiographic studies, pulse oximetry and re-evaluation of patient's condition.   Procedures   MEDICATIONS ORDERED IN ED: Medications  acetaminophen (TYLENOL) tablet 1,000 mg (1,000 mg Oral Given 07/08/23 1945)  cefTRIAXone (ROCEPHIN) 2 g in sodium chloride 0.9 % 100 mL IVPB (0 g Intravenous Stopped 07/08/23 2012)  azithromycin (ZITHROMAX) 500 mg in sodium chloride 0.9 % 250 mL IVPB (0 mg  Intravenous Stopped 07/08/23 2119)  ondansetron (ZOFRAN) 4 MG/2ML injection (4 mg  Given 07/08/23 1945)  iohexol (OMNIPAQUE) 300 MG/ML solution 100 mL (100 mLs Intravenous Contrast Given 07/08/23 2037)     IMPRESSION / MDM / ASSESSMENT AND PLAN / ED COURSE  I reviewed the triage vital signs and the nursing notes.  Differential diagnosis includes, but is not limited to, pneumonia, viral illness, colitis, other acute intra-abdominal process  Patient's presentation is most consistent with acute presentation with potential threat to life or bodily function.  53 year old female presenting with fever and weakness.  Tachycardic on presentation, later found to be febrile.  Labs notable for significant leukocytosis at 23.9.  Sepsis orders initiated with empiric Rocephin and azithromycin for possible respiratory source.  Also given Tylenol and Zofran for symptomatic treatment.  Chest x-Salik Grewell without acute findings, but CT ordered to further evaluate which did demonstrate multifocal pneumonia.  On evaluation after fluids and antibiotics, patient continues to feel significantly weak.  Does meet sepsis criteria, with this do think admission is reasonable.  Will reach out to hospitalist team.       FINAL CLINICAL IMPRESSION(S) / ED DIAGNOSES   Final diagnoses:  Multifocal pneumonia     Rx / DC Orders   ED Discharge Orders     None        Note:  This document was prepared using Dragon voice recognition software and may include unintentional dictation errors.   Trinna Post, MD 07/08/23 [phone]

## 2023-07-08 NOTE — H&P (Signed)
 [place]   PATIENT NAME: Ana Cannon    MR#:  [id number]  DATE OF BIRTH:  12/01/1970  DATE OF ADMISSION:  07/08/2023  PRIMARY CARE PHYSICIAN: Durenda Hurt, MD   Patient is coming from: Home  REQUESTING/REFERRING PHYSICIAN: Trinna Post, MD.  CHIEF COMPLAINT:   Chief Complaint  Patient presents with   Fever    HISTORY OF PRESENT ILLNESS:  Ana Cannon is a 53 y.o. African-American female with medical history significant for hypertension dyslipidemia as well as tobacco abuse and diastolic dysfunction, who presented to the emergency room with a Kalisetti of fever and chills with recent worsening cough as well as dyspnea with mild wheezing for the last few days.  She admitted to nausea and vomiting once with excessive cough.  She has been having mild diarrhea.  She stated her granddaughter was sick with the flu.  The patient admitted to diminished urine output.  No dysuria, hematuria or urinary frequency or urgency or flank pain.  ED Course: When the patient came to the ER, temperature was 99.9 with blood pressure of 144/97 and heart rate of 101 with otherwise normal vital signs.  Later on BP was 102.2.  Labs revealed mild hypokalemia of 3.4 with otherwise unremarkable CMP.  Lactic acid is 1.4.  CBC showed leukocytosis of 23.9 and thrombocytosis of 424.  Respiratory panel came back negative.  Seen and pregnancy test was negative.  Blood cultures were drawn. EKG as reviewed by me : None Imaging: Two-view chest x-ray showed no acute cardiopulmonary disease.   CT of the chest, abdomen pelvis revealed the following: Motion degraded images.   Multifocal pneumonia, right lung predominant, possibly on the basis of aspiration.   Mildly thick-walled bladder, nonspecific.  Correlate for cystitis.  The patient was given 1 g p.o. Tylenol, IV Rocephin and Zithromax.  The patient will be admitted to a progressive unit bed for further evaluation and management. PAST MEDICAL  HISTORY:   Past Medical History:  Diagnosis Date   Atypical chest pain    a. 05/2019 MV: EF>65%. No ischemia/scar-->treated w/ NSAIDS - ? pericarditis.   Diastolic dysfunction    a. 05/2019 Echo: EF 65-70%, mild LVH. Gr2 DD. No rwma. Nl RV size/fxn. Sm pericardial effusion. Triv MR.   Hyperlipemia    Hypertension    Tobacco abuse     PAST SURGICAL HISTORY:  History reviewed. No pertinent surgical history.  SOCIAL HISTORY:   Social History   Tobacco Use   Smoking status: Every Day    Current packs/day: 0.50    Types: Cigarettes   Smokeless tobacco: Never  Substance Use Topics   Alcohol use: Not Currently    FAMILY HISTORY:   Family History  Problem Relation Age of Onset   CAD Mother    CAD Father     DRUG ALLERGIES:  No Known Allergies  REVIEW OF SYSTEMS:   ROS As per history of present illness. All pertinent systems were reviewed above. Constitutional, HEENT, cardiovascular, respiratory, GI, GU, musculoskeletal, neuro, psychiatric, endocrine, integumentary and hematologic systems were reviewed and are otherwise negative/unremarkable except for positive findings mentioned above in the HPI.   MEDICATIONS AT HOME:   Prior to Admission medications   Medication Sig Start Date End Date Taking? Authorizing Provider  aluminum-magnesium hydroxide-simethicone (MAALOX) 200-200-20 MG/5ML SUSP Take 30 mLs by mouth 4 (four) times daily -  before meals and at bedtime. 01/11/18   Sharman Cheek, MD  amLODipine (NORVASC) 5 MG tablet Take 1  tablet (5 mg total) by mouth daily. 06/26/22 06/26/23  Willy Eddy, MD  clonazePAM (KLONOPIN) 0.5 MG tablet Take 0.5 mg by mouth 2 (two) times daily as needed. 05/26/19   [provider]  famotidine (PEPCID) 20 MG tablet Take 1 tablet (20 mg total) by mouth 2 (two) times daily. 01/11/18   Sharman Cheek, MD  Fluocinolone Acetonide Scalp 0.01 % OIL Wet or dampen hair and scalp thoroughly. Apply a thin film of fluocinolone acetonide  topical oil, 0.01% on the scalp, massage well and cover scalp with the supplied shower cap. Leave on overnight or for a minimum of 4 hours before washing off. Wash hair with regular shampoo and rinse thoroughly. 10/17/21   Varney Daily, PA  gabapentin (NEURONTIN) 300 MG capsule Take 300 mg by mouth 3 (three) times daily.    [provider]  ketorolac (TORADOL) 10 MG tablet Take 1 tablet (10 mg total) by mouth every 6 (six) hours as needed. 12/29/22   Merwyn Katos, MD  meclizine (ANTIVERT) 25 MG tablet Take 1 tablet (25 mg total) by mouth 3 (three) times daily as needed for dizziness. 08/16/21   Phineas Semen, MD  metoprolol tartrate (LOPRESSOR) 50 MG tablet Take 1 tablet (50 mg total) by mouth 2 (two) times daily. 06/07/19 09/05/19  Creig Hines, NP  mirtazapine (REMERON) 45 MG tablet Take 1 tablet (45 mg total) by mouth at bedtime. 06/09/22 07/09/22  Triplett, Kasandra Knudsen, FNP  norethindrone (AYGESTIN) 5 MG tablet Take 5 mg by mouth daily. 08/30/21   [provider]  ondansetron (ZOFRAN-ODT) 8 MG disintegrating tablet Take 1 tablet (8 mg total) by mouth every 8 (eight) hours as needed for nausea or vomiting. 12/29/22   Merwyn Katos, MD  pantoprazole (PROTONIX) 20 MG tablet Take 1 tablet (20 mg total) by mouth daily. 05/23/19 04/16/21  Charise Killian, MD  QUEtiapine (SEROQUEL) 25 MG tablet Take 25 mg by mouth at bedtime. 01/27/22   [provider]  risperiDONE (RISPERDAL) 1 MG tablet Take 1 tablet (1 mg total) by mouth at bedtime. 02/10/22 03/12/22  Georga Hacking, MD  risperiDONE (RISPERDAL) 1 MG tablet Take 1 tablet (1 mg total) by mouth at bedtime. 02/10/22 02/10/23  Clapacs, Jackquline Denmark, MD  valACYclovir (VALTREX) 500 MG tablet Take 500 mg by mouth 2 (two) times daily. 01/29/22   [provider]      VITAL SIGNS:  Blood pressure 130/84, pulse 83, temperature 98.2 F (36.8 C), temperature source Oral, resp. rate 13, height 5\' 7"  (1.702 m), weight  54.9 kg, SpO2 100%.  PHYSICAL EXAMINATION:  Physical Exam  GENERAL:  53 y.o.-year-old African-American female patient lying in the bed with no acute distress.  EYES: Pupils equal, round, reactive to light and accommodation. No scleral icterus. Extraocular muscles intact.  HEENT: Head atraumatic, normocephalic. Oropharynx and nasopharynx clear.  NECK:  Supple, no jugular venous distention. No thyroid enlargement, no tenderness.  LUNGS: Diminished bibasal breath sounds with bibasal crackles.  No use of accessory muscles of respiration.  CARDIOVASCULAR: Regular rate and rhythm, S1, S2 normal. No murmurs, rubs, or gallops.  ABDOMEN: Soft, nondistended, nontender. Bowel sounds present. No organomegaly or mass.  EXTREMITIES: No pedal edema, cyanosis, or clubbing.  NEUROLOGIC: Cranial nerves II through XII are intact. Muscle strength 5/5 in all extremities. Sensation intact. Gait not checked.  PSYCHIATRIC: The patient is alert and oriented x 3.  Normal affect and good eye contact. SKIN: No obvious rash, lesion, or ulcer.  LABORATORY PANEL:   CBC Recent Labs  Lab 07/08/23 1800  WBC 23.9*  HGB 12.1  HCT 35.1*  PLT 424*   ------------------------------------------------------------------------------------------------------------------  Chemistries  Recent Labs  Lab 07/08/23 1800  NA 138  K 3.4*  CL 102  CO2 24  GLUCOSE 112*  BUN 6  CREATININE 0.64  CALCIUM 9.3  AST 15  ALT 10  ALKPHOS 63  BILITOT 0.7   ------------------------------------------------------------------------------------------------------------------  Cardiac Enzymes No results for input(s): "TROPONINI" in the last 168 hours. ------------------------------------------------------------------------------------------------------------------  RADIOLOGY:  CT CHEST ABDOMEN PELVIS W CONTRAST Result Date: 07/08/2023 CLINICAL DATA:  Sepsis EXAM: CT CHEST, ABDOMEN, AND PELVIS WITH CONTRAST TECHNIQUE: Multidetector  CT imaging of the chest, abdomen and pelvis was performed following the standard protocol during bolus administration of intravenous contrast. RADIATION DOSE REDUCTION: This exam was performed according to the departmental dose-optimization program which includes automated exposure control, adjustment of the mA and/or kV according to patient size and/or use of iterative reconstruction technique. CONTRAST:  OMNIPAQUE IOHEXOL 300 MG/ML  SOLN COMPARISON:  CT abdomen/pelvis dated 12/29/2022. CTA chest dated 05/22/2019. FINDINGS: Motion degraded images. CT CHEST FINDINGS Cardiovascular: The heart is normal in size. No pericardial effusion. No evidence of thoracic aortic aneurysm. Mediastinum/Nodes: No suspicious mediastinal lymphadenopathy. Visualized thyroid is unremarkable. Lungs/Pleura: Evaluation lung parenchyma is constrained by respiratory motion. Within that constraint, there are no suspicious pulmonary nodules. Multifocal patchy/nodular opacities inferiorly in the right middle lobe and in the bilateral lower lobes, suggesting multifocal infection/pneumonia. Associated mild bronchiectasis/bronchial wall thickening, raising the possibility of underlying aspiration. No pleural effusion or pneumothorax. Musculoskeletal: Visualized osseous structures are within normal limits. CT ABDOMEN PELVIS FINDINGS Hepatobiliary: Liver is within normal limits. Gallbladder is unremarkable. No intrahepatic or extrahepatic ductal dilatation. Pancreas: Within normal limits. Spleen: Atrophic splenic tissue with splenosis, chronic. Adrenals/Urinary Tract: Adrenal glands are within normal limits. Kidneys are within normal limits.  No hydronephrosis. Mildly thick-walled bladder, although underdistended. Stomach/Bowel: Stomach is within normal limits. No evidence of bowel obstruction. Appendix is not discretely visualized. No colonic wall thickening or inflammatory changes. Vascular/Lymphatic: No evidence of abdominal aortic  aneurysm. No suspicious abdominopelvic lymphadenopathy. Reproductive: Uterus is within normal limits. Bilateral ovaries are within normal limits Other: No abdominopelvic ascites. Musculoskeletal: Visualized osseous structures are within normal limits. IMPRESSION: Motion degraded images. Multifocal pneumonia, right lung predominant, possibly on the basis of aspiration. Mildly thick-walled bladder, nonspecific.  Correlate for cystitis. Electronically Signed   By: Charline Bills M.D.   On: 07/08/2023 21:12   DG Chest 2 View Result Date: 07/08/2023 CLINICAL DATA:  Infection. Patient reports being seen 07/02/2023 for fever with generalized fatigue nausea and vomiting. EXAM: CHEST - 2 VIEW COMPARISON:  AP chest 06/27/2023, chest two views 02/05/2022 FINDINGS: Cardiac silhouette and mediastinal contours are within normal limits. Mild chronic elevation of the left hemidiaphragm, unchanged. The lungs are clear. No pleural effusion or pneumothorax. Minimal dextrocurvature of the mid to upper thoracic spine and minimal levocurvature of the thoracolumbar junction. IMPRESSION: No active cardiopulmonary disease. Electronically Signed   By: Neita Garnet M.D.   On: 07/08/2023 18:27      IMPRESSION AND PLAN:  Assessment and Plan: * Sepsis due to pneumonia [facility]) - The patient will be admitted to a progressive unit bed. - Will continue antibiotic therapy with IV Rocephin and Zithromax. - Mucolytic therapy be provided as well as duo nebs q.i.d. and q.4 hours p.r.n. - We will follow blood cultures. - Sepsis is based on leukocytosis and fever. - Will continue  hydration with IV lactated ringer.  Essential hypertension - We will continue antihypertensive therapy.  Anxiety and depression ,- We will continue Klonopin, Remeron and Risperdal.  GERD without esophagitis - We will continue PPI therapy and H2 blocker therapy.   DVT prophylaxis: Lovenox.  Advanced Care Planning:  Code Status: full code.  Family  Communication:  The plan of care was discussed in details with the patient (and family). I answered all questions. The patient agreed to proceed with the above mentioned plan. Further management will depend upon hospital course. Disposition Plan: Back to previous home environment Consults called: none.  All the records are reviewed and case discussed with ED provider.  Status is: Inpatient  At the time of the admission, it appears that the appropriate admission status for this patient is inpatient.  This is judged to be reasonable and necessary in order to provide the required intensity of service to ensure the patient's safety given the presenting symptoms, physical exam findings and initial radiographic and laboratory data in the context of comorbid conditions.  The patient requires inpatient status due to high intensity of service, high risk of further deterioration and high frequency of surveillance required.  I certify that at the time of admission, it is my clinical judgment that the patient will require inpatient hospital care extending more than 2 midnights.                            Dispo: The patient is from: Home              Anticipated d/c is to: Home              Patient currently is not medically stable to d/c.              Difficult to place patient: No  Hannah Beat M.D on 07/09/2023 at 3:46 AM  [facility]   From 7 PM-7 AM, contact night-coverage [email or web address]  CC: Primary care physician; Durenda Hurt, MD

## 2023-07-09 ENCOUNTER — Encounter: Payer: Self-pay | Admitting: Internal Medicine

## 2023-07-09 DIAGNOSIS — K219 Gastro-esophageal reflux disease without esophagitis: Secondary | ICD-10-CM

## 2023-07-09 DIAGNOSIS — F419 Anxiety disorder, unspecified: Secondary | ICD-10-CM

## 2023-07-09 DIAGNOSIS — J189 Pneumonia, unspecified organism: Secondary | ICD-10-CM | POA: Diagnosis not present

## 2023-07-09 DIAGNOSIS — A419 Sepsis, unspecified organism: Secondary | ICD-10-CM | POA: Diagnosis not present

## 2023-07-09 DIAGNOSIS — I10 Essential (primary) hypertension: Secondary | ICD-10-CM

## 2023-07-09 LAB — URINALYSIS, W/ REFLEX TO CULTURE (INFECTION SUSPECTED)
Bacteria, UA: NONE SEEN
Bilirubin Urine: NEGATIVE
Glucose, UA: 150 mg/dL — AB
Hgb urine dipstick: NEGATIVE
Ketones, ur: NEGATIVE mg/dL
Leukocytes,Ua: NEGATIVE
Nitrite: NEGATIVE
Protein, ur: NEGATIVE mg/dL
RBC / HPF: 0 RBC/hpf (ref 0–5)
Specific Gravity, Urine: 1.008 (ref 1.005–1.030)
pH: 7 (ref 5.0–8.0)

## 2023-07-09 LAB — BASIC METABOLIC PANEL
Anion gap: 7 (ref 5–15)
BUN: 6 mg/dL (ref 6–20)
CO2: 26 mmol/L (ref 22–32)
Calcium: 8.5 mg/dL — ABNORMAL LOW (ref 8.9–10.3)
Chloride: 106 mmol/L (ref 98–111)
Creatinine, Ser: 0.65 mg/dL (ref 0.44–1.00)
GFR, Estimated: >60 mL/min (ref >60)
Glucose, Bld: 104 mg/dL — ABNORMAL HIGH (ref 70–99)
Potassium: 3.1 mmol/L — ABNORMAL LOW (ref 3.5–5.1)
Sodium: 139 mmol/L (ref 135–145)

## 2023-07-09 LAB — CBC
HCT: 30.9 % — ABNORMAL LOW (ref 36.0–46.0)
Hemoglobin: 10.9 g/dL — ABNORMAL LOW (ref 12.0–15.0)
MCH: 29.9 pg (ref 26.0–34.0)
MCHC: 35.3 g/dL (ref 30.0–36.0)
MCV: 84.7 fL (ref 80.0–100.0)
Platelets: 393 10*3/uL (ref 150–400)
RBC: 3.65 MIL/uL — ABNORMAL LOW (ref 3.87–5.11)
RDW: 14.1 % (ref 11.5–15.5)
WBC: 16.8 10*3/uL — ABNORMAL HIGH (ref 4.0–10.5)
nRBC: 0 % (ref 0.0–0.2)

## 2023-07-09 LAB — MAGNESIUM: Magnesium: 1.8 mg/dL (ref 1.7–2.4)

## 2023-07-09 LAB — PROTIME-INR
INR: 1.3 — ABNORMAL HIGH (ref 0.8–1.2)
Prothrombin Time: 16.3 s — ABNORMAL HIGH (ref 11.4–15.2)

## 2023-07-09 LAB — HIV ANTIBODY (ROUTINE TESTING W REFLEX): HIV Screen 4th Generation wRfx: NONREACTIVE

## 2023-07-09 LAB — CORTISOL-AM, BLOOD: Cortisol - AM: 6.4 ug/dL — ABNORMAL LOW (ref 6.7–22.6)

## 2023-07-09 MED ORDER — ONDANSETRON HCL 4 MG PO TABS: 4.0000 mg | ORAL_TABLET | Freq: Four times a day (QID) | ORAL | Status: DC | PRN

## 2023-07-09 MED ORDER — MIRTAZAPINE 15 MG PO TABS
45.0000 mg | ORAL_TABLET | Freq: Every day | ORAL | Status: DC
  Administered 2023-07-09 – 2023-07-10 (×3): 45 mg via ORAL
  Filled 2023-07-09 (×3): qty 3

## 2023-07-09 MED ORDER — GUAIFENESIN ER 600 MG PO TB12
600.0000 mg | ORAL_TABLET | Freq: Two times a day (BID) | ORAL | Status: DC
  Administered 2023-07-09 – 2023-07-11 (×6): 600 mg via ORAL
  Filled 2023-07-09 (×6): qty 1

## 2023-07-09 MED ORDER — AMLODIPINE BESYLATE 5 MG PO TABS
5.0000 mg | ORAL_TABLET | Freq: Every day | ORAL | Status: DC
  Administered 2023-07-09 – 2023-07-11 (×3): 5 mg via ORAL
  Filled 2023-07-09 (×3): qty 1

## 2023-07-09 MED ORDER — RISPERIDONE 1 MG PO TABS: 1.0000 mg | ORAL_TABLET | Freq: Every day | ORAL | Status: DC

## 2023-07-09 MED ORDER — PANTOPRAZOLE SODIUM 20 MG PO TBEC
20.0000 mg | DELAYED_RELEASE_TABLET | Freq: Every day | ORAL | Status: DC
  Administered 2023-07-09 – 2023-07-11 (×3): 20 mg via ORAL
  Filled 2023-07-09 (×4): qty 1

## 2023-07-09 MED ORDER — IPRATROPIUM-ALBUTEROL 0.5-2.5 (3) MG/3ML IN SOLN
3.0000 mL | RESPIRATORY_TRACT | Status: DC | PRN
  Administered 2023-07-11: 3 mL via RESPIRATORY_TRACT
  Filled 2023-07-09: qty 3

## 2023-07-09 MED ORDER — LACTATED RINGERS IV SOLN
150.0000 mL/h | INTRAVENOUS | Status: AC
  Administered 2023-07-09 (×3): 150 mL/h via INTRAVENOUS

## 2023-07-09 MED ORDER — ENOXAPARIN SODIUM 40 MG/0.4ML IJ SOSY
40.0000 mg | PREFILLED_SYRINGE | INTRAMUSCULAR | Status: DC
  Administered 2023-07-09 – 2023-07-11 (×3): 40 mg via SUBCUTANEOUS
  Filled 2023-07-09 (×3): qty 0.4

## 2023-07-09 MED ORDER — ONDANSETRON HCL 4 MG/2ML IJ SOLN
4.0000 mg | Freq: Four times a day (QID) | INTRAMUSCULAR | Status: DC | PRN
  Administered 2023-07-09 – 2023-07-10 (×3): 4 mg via INTRAVENOUS
  Filled 2023-07-09 (×3): qty 2

## 2023-07-09 MED ORDER — ACETAMINOPHEN 325 MG PO TABS
650.0000 mg | ORAL_TABLET | Freq: Four times a day (QID) | ORAL | Status: DC | PRN
  Administered 2023-07-09: 650 mg via ORAL
  Filled 2023-07-09: qty 2

## 2023-07-09 MED ORDER — TRAZODONE HCL 50 MG PO TABS
25.0000 mg | ORAL_TABLET | Freq: Every evening | ORAL | Status: DC | PRN
  Administered 2023-07-09: 25 mg via ORAL
  Filled 2023-07-09: qty 1

## 2023-07-09 MED ORDER — METOPROLOL TARTRATE 50 MG PO TABS
50.0000 mg | ORAL_TABLET | Freq: Two times a day (BID) | ORAL | Status: DC
  Administered 2023-07-09 – 2023-07-11 (×5): 50 mg via ORAL
  Filled 2023-07-09 (×2): qty 1
  Filled 2023-07-09: qty 2
  Filled 2023-07-09 (×2): qty 1

## 2023-07-09 MED ORDER — SODIUM CHLORIDE 0.9 % IV SOLN
2.0000 g | INTRAVENOUS | Status: AC
  Administered 2023-07-09 – 2023-07-10 (×2): 2 g via INTRAVENOUS
  Filled 2023-07-09 (×2): qty 20

## 2023-07-09 MED ORDER — OLANZAPINE 2.5 MG PO TABS
2.5000 mg | ORAL_TABLET | Freq: Every day | ORAL | Status: DC
  Administered 2023-07-09 – 2023-07-10 (×3): 2.5 mg via ORAL
  Filled 2023-07-09 (×3): qty 1

## 2023-07-09 MED ORDER — MAGNESIUM HYDROXIDE 400 MG/5ML PO SUSP: 30.0000 mL | Freq: Every day | ORAL | Status: DC | PRN

## 2023-07-09 MED ORDER — POTASSIUM CHLORIDE CRYS ER 20 MEQ PO TBCR
40.0000 meq | EXTENDED_RELEASE_TABLET | Freq: Once | ORAL | Status: AC
  Administered 2023-07-09: 40 meq via ORAL
  Filled 2023-07-09: qty 2

## 2023-07-09 MED ORDER — ACETAMINOPHEN 650 MG RE SUPP
650.0000 mg | Freq: Four times a day (QID) | RECTAL | Status: DC | PRN
Start: 2023-07-09 — End: 2023-07-11

## 2023-07-09 MED ORDER — IPRATROPIUM-ALBUTEROL 0.5-2.5 (3) MG/3ML IN SOLN
3.0000 mL | Freq: Four times a day (QID) | RESPIRATORY_TRACT | Status: DC
  Administered 2023-07-09 – 2023-07-10 (×5): 3 mL via RESPIRATORY_TRACT
  Filled 2023-07-09 (×5): qty 3

## 2023-07-09 MED ORDER — SODIUM CHLORIDE 0.9 % IV SOLN
500.0000 mg | INTRAVENOUS | Status: DC
  Administered 2023-07-09: 500 mg via INTRAVENOUS
  Filled 2023-07-09 (×2): qty 5

## 2023-07-09 MED ORDER — HYDROCOD POLI-CHLORPHE POLI ER 10-8 MG/5ML PO SUER
5.0000 mL | Freq: Two times a day (BID) | ORAL | Status: DC | PRN
  Administered 2023-07-09 – 2023-07-10 (×2): 5 mL via ORAL
  Filled 2023-07-09 (×2): qty 5

## 2023-07-09 NOTE — Assessment & Plan Note (Addendum)
,-   We will continue Klonopin, Remeron and Risperdal.

## 2023-07-09 NOTE — Assessment & Plan Note (Signed)
-   We will continue antihypertensive therapy.

## 2023-07-09 NOTE — Assessment & Plan Note (Signed)
-   We will continue PPI therapy and H2 blocker therapy.

## 2023-07-09 NOTE — Assessment & Plan Note (Addendum)
-   The patient will be admitted to a progressive unit bed. - Will continue antibiotic therapy with IV Rocephin and Zithromax. - Mucolytic therapy be provided as well as duo nebs q.i.d. and q.4 hours p.r.n. - We will follow blood cultures. - Sepsis is based on leukocytosis and fever. - Will continue hydration with IV lactated ringer.

## 2023-07-09 NOTE — ED Notes (Signed)
Informed RN bed assigned 

## 2023-07-09 NOTE — ED Notes (Signed)
 Pt coughing not relieved by sips of water, medicated PRN. Afebrile.

## 2023-07-09 NOTE — Progress Notes (Addendum)
  Progress Note   Patient: Ana Cannon ZOX:[id number] DOB: 15-Jan-1971 DOA: 07/08/2023     1 DOS: the patient was seen and examined on 07/09/2023   Brief hospital course:  HPI on admission 07/08/23: "SHATORA WEATHERBEE is a 53 y.o. African-American female with medical history significant for hypertension dyslipidemia as well as tobacco abuse and diastolic dysfunction, who presented to the emergency room with a Kalisetti of fever and chills with recent worsening cough as well as dyspnea with mild wheezing for the last few days.  She admitted to nausea and vomiting once with excessive cough.  She has been having mild diarrhea.  She stated her granddaughter was sick with the flu ..." See H&P for full HPI on admission & ED course.  Pt was admitted for further evaluation and management of sepsis due to pneumonia and possible UTI, started on empiric IV antibiotics.  Further hospital course and management as outlined below.    Assessment and Plan:  * Sepsis due to pneumonia  Sepsis POA with leukocytosis, fever and tachycardia.  No evidence of organ dysfunction. --Continue IV RRocephin and Zithromax --Supportive care per orders with mucolytics, bronchodilators --Pulmonary hygiene with IS and flutter --Follow blood cultures --Collect sputum culture if able to collect --Check urinary ag's for legionella and strep pneumo --Finishing up sepsis IV fluids today --Monitor fever curve, CBC, hemodynamics --O2 per protocol, supplement O2 if sats < 90% on room air  Hypokalemia - K 3.1 is being replace --Monitor BMP, replace K PRN --Check Mg level  Essential hypertension --Continue home metoprolol, amlodipine  Anxiety and depression --Continue home Klonopin, Remeron and Zyprexa  GERD without esophagitis --Continue PPI and H2 blocker       Subjective: Pt seen in ED holding for a bed today.  She reports ongoing severe coughing.  No fever/chills.  No UTI symptoms but was having difficulty  voiding.  No dysuria.   Physical Exam: Vitals:   07/09/23 1026 07/09/23 1035 07/09/23 1200 07/09/23 1301  BP: (!) 129/92 (!) 129/92 (!) 133/94 (!) 137/93  Pulse: 72 73 71 80  Resp:  16 18 19   Temp:  98.5 F (36.9 C)  98.6 F (37 C)  TempSrc:  Oral  Oral  SpO2:  100% 100% 97%  Weight:      Height:       General exam: awake, alert, no acute distress, ill appearing HEENT: moist mucus membranes, hearing grossly normal  Respiratory system: scattered rhonchi, normal respiratory effort. Cardiovascular system: normal S1/S2, RRR, no pedal edema.   Gastrointestinal system: soft, NT, ND, no HSM felt, +bowel sounds. Central nervous system: A&O x3. no gross focal neurologic deficits, normal speech Extremities: moves all, no edema, normal tone Skin: dry, intact, normal temperature Psychiatry: normal mood, congruent affect, judgement and insight appear normal    Data Reviewed:  Notable labs --  K 3.1, glucose 104, Ca 8.5, WBC improving 16.8, Hbg 10.9  Family Communication: None present. Pt able to update.  Disposition: Status is: Inpatient Remains inpatient appropriate because: remains on IV antibiotics pending further improvement   Planned Discharge Destination: Home    Time spent: 42 minutes  Author: Pennie Banter, DO 07/09/2023 1:32 PM  For on call review [email or web address].

## 2023-07-09 NOTE — ED Notes (Signed)
 Pt ambulates to BR w/ steady gait. Nurse walks by side.

## 2023-07-10 DIAGNOSIS — A419 Sepsis, unspecified organism: Secondary | ICD-10-CM | POA: Diagnosis not present

## 2023-07-10 DIAGNOSIS — J189 Pneumonia, unspecified organism: Secondary | ICD-10-CM | POA: Diagnosis not present

## 2023-07-10 LAB — CBC
HCT: 30.6 % — ABNORMAL LOW (ref 36.0–46.0)
Hemoglobin: 10.5 g/dL — ABNORMAL LOW (ref 12.0–15.0)
MCH: 29.6 pg (ref 26.0–34.0)
MCHC: 34.3 g/dL (ref 30.0–36.0)
MCV: 86.2 fL (ref 80.0–100.0)
Platelets: 438 10*3/uL — ABNORMAL HIGH (ref 150–400)
RBC: 3.55 MIL/uL — ABNORMAL LOW (ref 3.87–5.11)
RDW: 14.2 % (ref 11.5–15.5)
WBC: 13.2 10*3/uL — ABNORMAL HIGH (ref 4.0–10.5)
nRBC: 0 % (ref 0.0–0.2)

## 2023-07-10 LAB — BASIC METABOLIC PANEL
Anion gap: 5 (ref 5–15)
BUN: <5 mg/dL — ABNORMAL LOW (ref 6–20)
CO2: 27 mmol/L (ref 22–32)
Calcium: 8.8 mg/dL — ABNORMAL LOW (ref 8.9–10.3)
Chloride: 109 mmol/L (ref 98–111)
Creatinine, Ser: 0.72 mg/dL (ref 0.44–1.00)
GFR, Estimated: >60 mL/min (ref >60)
Glucose, Bld: 92 mg/dL (ref 70–99)
Potassium: 3.7 mmol/L (ref 3.5–5.1)
Sodium: 141 mmol/L (ref 135–145)

## 2023-07-10 LAB — STREP PNEUMONIAE URINARY ANTIGEN: Strep Pneumo Urinary Antigen: NEGATIVE

## 2023-07-10 MED ORDER — AZITHROMYCIN 500 MG PO TABS
500.0000 mg | ORAL_TABLET | Freq: Every day | ORAL | Status: DC
  Administered 2023-07-10: 500 mg via ORAL
  Filled 2023-07-10: qty 1

## 2023-07-10 MED ORDER — AMOXICILLIN-POT CLAVULANATE 875-125 MG PO TABS: 1.0000 | ORAL_TABLET | Freq: Two times a day (BID) | ORAL | Status: DC

## 2023-07-10 MED ORDER — PROMETHAZINE HCL 25 MG PO TABS
25.0000 mg | ORAL_TABLET | Freq: Four times a day (QID) | ORAL | Status: DC | PRN
  Administered 2023-07-10: 25 mg via ORAL
  Filled 2023-07-10 (×2): qty 1

## 2023-07-10 NOTE — Plan of Care (Signed)
  Problem: Fluid Volume: Goal: Hemodynamic stability will improve Outcome: Progressing   Problem: Clinical Measurements: Goal: Diagnostic test results will improve Outcome: Progressing Goal: Signs and symptoms of infection will decrease Outcome: Progressing   Problem: Respiratory: Goal: Ability to maintain adequate ventilation will improve Outcome: Progressing   Problem: Education: Goal: Knowledge of General Education information will improve Description: Including pain rating scale, medication(s)/side effects and non-pharmacologic comfort measures Outcome: Progressing   Problem: Health Behavior/Discharge Planning: Goal: Ability to manage health-related needs will improve Outcome: Progressing   Problem: Clinical Measurements: Goal: Ability to maintain clinical measurements within normal limits will improve Outcome: Progressing Goal: Will remain free from infection Outcome: Progressing Goal: Diagnostic test results will improve Outcome: Progressing Goal: Respiratory complications will improve Outcome: Progressing Goal: Cardiovascular complication will be avoided Outcome: Progressing   Problem: Activity: Goal: Risk for activity intolerance will decrease Outcome: Progressing   Problem: Nutrition: Goal: Adequate nutrition will be maintained Outcome: Progressing

## 2023-07-10 NOTE — Progress Notes (Signed)
 Progress Note   Patient: Ana Cannon UJW:[id number] DOB: [birth year] DOA: 07/08/2023     2 DOS: the patient was seen and examined on 07/10/2023   Brief hospital course:  HPI on admission 07/08/23: "Ana Cannon is a 53 y.o. African-American female with medical history significant for hypertension dyslipidemia as well as tobacco abuse and diastolic dysfunction, who presented to the emergency room with a Kalisetti of fever and chills with recent worsening cough as well as dyspnea with mild wheezing for the last few days.  She admitted to nausea and vomiting once with excessive cough.  She has been having mild diarrhea.  She stated her granddaughter was sick with the flu ..." See H&P for full HPI on admission & ED course.  Pt was admitted for further evaluation and management of sepsis due to pneumonia and possible UTI, started on empiric IV antibiotics.  Further hospital course and management as outlined below.    Assessment and Plan:  * Sepsis due to pneumonia  Sepsis POA with leukocytosis, fever and tachycardia.  No evidence of organ dysfunction. Strep pneumo Ag negative. --Transition IV Rocephin and Zithromax >> PO antibiotics tomorrow --Supportive care per orders with mucolytics, bronchodilators --Pulmonary hygiene with IS and flutter --Follow blood cultures --Follow sputum culture  --Follow urinary ag's for legionella  --Completed sepsis IV fluids & sepsis physiology has improved --Monitor fever curve, CBC, hemodynamics --O2 per protocol, supplement O2 if sats < 90% on room air  Nausea - suspect due to viral illness --IV Zofran PRN --Trial PO phenergan since Zofran ODT's were not working at home  Hypokalemia - K 3.1 was replaced. Mg  normal 1. 8. --Monitor BMP, replace K PRNl  Essential hypertension --Continue home metoprolol, amlodipine  Anxiety and depression --Continue home Klonopin, Remeron and Zyprexa  GERD without esophagitis --Continue PPI and H2  blocker       Subjective: Pt seen awake resting in bed this AM.  Having ongoing nausea and only taking a few bites of meals.  Zofran helpful.  Ongoing productive cough but no dyspnea or fever/chills at this point.  Feels overall slightly better.  Pt reports Zofran ODT's at home were not relieving nausea.   Physical Exam: Vitals:   07/09/23 2149 07/10/23 0352 07/10/23 0810 07/10/23 0915  BP:  132/80  138/89  Pulse:  78 97 79  Resp:  17 18 18   Temp:  98.6 F (37 C)  98.5 F (36.9 C)  TempSrc:      SpO2: 95% 97% 97% 99%  Weight:      Height:       General exam: awake, alert, no acute distress, mildly ill appearing HEENT: moist mucus membranes, hearing grossly normal  Respiratory system: persistent rhonchi, normal respiratory effort on room air, no wheezes Cardiovascular system: normal S1/S2, RRR, no pedal edema.   Gastrointestinal system: soft, NT, ND Central nervous system: A&O x3. no gross focal neurologic deficits, normal speech Extremities: moves all, no edema, normal tone Psychiatry: normal mood, congruent affect, judgement and insight appear normal    Data Reviewed:  Notable labs --  BMP normal except BUN < 5 and Ca 8.8.   WBC improving 23.9 >> 16.8 >> 13.2 Hbg stable 10.9 >> 10.5 Platelets 438k    Family Communication: None present. Pt able to update.   Disposition: Status is: Inpatient Remains inpatient appropriate because: ongoing nausea requiring IV antiemetics     Planned Discharge Destination: Home    Time spent: 36 minutes  Author: Alphonsus Sias  Denton Lank, DO 07/10/2023 1:26 PM  For on call review [email or web address].

## 2023-07-11 DIAGNOSIS — A419 Sepsis, unspecified organism: Secondary | ICD-10-CM | POA: Diagnosis not present

## 2023-07-11 DIAGNOSIS — J189 Pneumonia, unspecified organism: Secondary | ICD-10-CM | POA: Diagnosis not present

## 2023-07-11 LAB — BASIC METABOLIC PANEL
Anion gap: 6 (ref 5–15)
BUN: 5 mg/dL — ABNORMAL LOW (ref 6–20)
CO2: 28 mmol/L (ref 22–32)
Calcium: 8.8 mg/dL — ABNORMAL LOW (ref 8.9–10.3)
Chloride: 105 mmol/L (ref 98–111)
Creatinine, Ser: 0.58 mg/dL (ref 0.44–1.00)
GFR, Estimated: >60 mL/min (ref >60)
Glucose, Bld: 98 mg/dL (ref 70–99)
Potassium: 3.7 mmol/L (ref 3.5–5.1)
Sodium: 139 mmol/L (ref 135–145)

## 2023-07-11 LAB — MAGNESIUM: Magnesium: 2 mg/dL (ref 1.7–2.4)

## 2023-07-11 LAB — CBC
HCT: 30.1 % — ABNORMAL LOW (ref 36.0–46.0)
Hemoglobin: 10.5 g/dL — ABNORMAL LOW (ref 12.0–15.0)
MCH: 29.5 pg (ref 26.0–34.0)
MCHC: 34.9 g/dL (ref 30.0–36.0)
MCV: 84.6 fL (ref 80.0–100.0)
Platelets: 484 10*3/uL — ABNORMAL HIGH (ref 150–400)
RBC: 3.56 MIL/uL — ABNORMAL LOW (ref 3.87–5.11)
RDW: 14.3 % (ref 11.5–15.5)
WBC: 8.9 10*3/uL (ref 4.0–10.5)
nRBC: 0 % (ref 0.0–0.2)

## 2023-07-11 MED ORDER — ALBUTEROL SULFATE HFA 108 (90 BASE) MCG/ACT IN AERS
2.0000 | INHALATION_SPRAY | RESPIRATORY_TRACT | Status: DC | PRN
  Filled 2023-07-11 (×2): qty 6.7

## 2023-07-11 MED ORDER — GUAIFENESIN ER 600 MG PO TB12: 600.0000 mg | ORAL_TABLET | Freq: Two times a day (BID) | ORAL | 0 refills | Status: AC

## 2023-07-11 MED ORDER — AMOXICILLIN-POT CLAVULANATE 875-125 MG PO TABS: 1.0000 | ORAL_TABLET | Freq: Two times a day (BID) | ORAL | 0 refills | Status: DC

## 2023-07-11 MED ORDER — HYDROCOD POLI-CHLORPHE POLI ER 10-8 MG/5ML PO SUER: 5.0000 mL | Freq: Two times a day (BID) | ORAL | 0 refills | Status: DC | PRN

## 2023-07-11 MED ORDER — AZITHROMYCIN 500 MG PO TABS: 500.0000 mg | ORAL_TABLET | Freq: Every day | ORAL | 0 refills | Status: DC

## 2023-07-11 MED ORDER — PROMETHAZINE HCL 25 MG PO TABS: 25.0000 mg | ORAL_TABLET | Freq: Four times a day (QID) | ORAL | 0 refills | Status: DC | PRN

## 2023-07-11 NOTE — Discharge Summary (Signed)
 Physician Discharge Summary   Patient: LAMAE FOSCO MRN: [id number] DOB: [birth year]  Admit date:     07/08/2023  Discharge date: 07/11/2023  Discharge Physician: Pennie Banter   PCP: Durenda Hurt, MD   Recommendations at discharge:  {Tip this will not be part of the note when signed- Example include specific recommendations for outpatient follow-up, pending tests to follow-up on. (Optional):26781}  Follow up with Primary Care in 1-2 weeks Repeat CBC, BMP at follow up  Discharge Diagnoses: Principal Problem:   Sepsis due to pneumonia [facility]) Active Problems:   Essential hypertension   Anxiety and depression   GERD without esophagitis  Resolved Problems:   * No resolved hospital problems. [facility] Course: No notes on file  Assessment and Plan: * Sepsis due to pneumonia [facility]) - The patient will be admitted to a progressive unit bed. - Will continue antibiotic therapy with IV Rocephin and Zithromax. - Mucolytic therapy be provided as well as duo nebs q.i.d. and q.4 hours p.r.n. - We will follow blood cultures. - Sepsis is based on leukocytosis and fever. - Will continue hydration with IV lactated ringer.  Essential hypertension - We will continue antihypertensive therapy.  Anxiety and depression ,- We will continue Klonopin, Remeron and Risperdal.  GERD without esophagitis - We will continue PPI therapy and H2 blocker therapy.      {Tip this will not be part of the note when signed Body mass index is 18.95 kg/m. , ,  (Optional):26781}  {(NOTE) Pain control PDMP Statment (Optional):26782} Consultants: *** Procedures performed: ***  Disposition: {Plan; Disposition:26390} Diet recommendation:  {Diet_Plan:26776} DISCHARGE MEDICATION: Allergies as of 07/11/2023   No Known Allergies   Med Rec must be completed prior to using this [facility]***       Discharge Exam: Filed Weights   07/08/23 1758  Weight: 54.9 kg   ***  Condition at discharge:  {DC Condition:26389}  The results of significant diagnostics from this hospitalization (including imaging, microbiology, ancillary and laboratory) are listed below for reference.   Imaging Studies: CT CHEST ABDOMEN PELVIS W CONTRAST Result Date: 07/08/2023 CLINICAL DATA:  Sepsis EXAM: CT CHEST, ABDOMEN, AND PELVIS WITH CONTRAST TECHNIQUE: Multidetector CT imaging of the chest, abdomen and pelvis was performed following the standard protocol during bolus administration of intravenous contrast. RADIATION DOSE REDUCTION: This exam was performed according to the departmental dose-optimization program which includes automated exposure control, adjustment of the mA and/or kV according to patient size and/or use of iterative reconstruction technique. CONTRAST:  OMNIPAQUE IOHEXOL 300 MG/ML  SOLN COMPARISON:  CT abdomen/pelvis dated 12/29/2022. CTA chest dated 05/22/2019. FINDINGS: Motion degraded images. CT CHEST FINDINGS Cardiovascular: The heart is normal in size. No pericardial effusion. No evidence of thoracic aortic aneurysm. Mediastinum/Nodes: No suspicious mediastinal lymphadenopathy. Visualized thyroid is unremarkable. Lungs/Pleura: Evaluation lung parenchyma is constrained by respiratory motion. Within that constraint, there are no suspicious pulmonary nodules. Multifocal patchy/nodular opacities inferiorly in the right middle lobe and in the bilateral lower lobes, suggesting multifocal infection/pneumonia. Associated mild bronchiectasis/bronchial wall thickening, raising the possibility of underlying aspiration. No pleural effusion or pneumothorax. Musculoskeletal: Visualized osseous structures are within normal limits. CT ABDOMEN PELVIS FINDINGS Hepatobiliary: Liver is within normal limits. Gallbladder is unremarkable. No intrahepatic or extrahepatic ductal dilatation. Pancreas: Within normal limits. Spleen: Atrophic splenic tissue with splenosis, chronic. Adrenals/Urinary Tract: Adrenal glands are  within normal limits. Kidneys are within normal limits.  No hydronephrosis. Mildly thick-walled bladder, although underdistended. Stomach/Bowel: Stomach is within normal limits. No evidence  of bowel obstruction. Appendix is not discretely visualized. No colonic wall thickening or inflammatory changes. Vascular/Lymphatic: No evidence of abdominal aortic aneurysm. No suspicious abdominopelvic lymphadenopathy. Reproductive: Uterus is within normal limits. Bilateral ovaries are within normal limits Other: No abdominopelvic ascites. Musculoskeletal: Visualized osseous structures are within normal limits. IMPRESSION: Motion degraded images. Multifocal pneumonia, right lung predominant, possibly on the basis of aspiration. Mildly thick-walled bladder, nonspecific.  Correlate for cystitis. Electronically Signed   By: Charline Bills M.D.   On: 07/08/2023 21:12   DG Chest 2 View Result Date: 07/08/2023 CLINICAL DATA:  Infection. Patient reports being seen 07/02/2023 for fever with generalized fatigue nausea and vomiting. EXAM: CHEST - 2 VIEW COMPARISON:  AP chest 06/27/2023, chest two views 02/05/2022 FINDINGS: Cardiac silhouette and mediastinal contours are within normal limits. Mild chronic elevation of the left hemidiaphragm, unchanged. The lungs are clear. No pleural effusion or pneumothorax. Minimal dextrocurvature of the mid to upper thoracic spine and minimal levocurvature of the thoracolumbar junction. IMPRESSION: No active cardiopulmonary disease. Electronically Signed   By: Neita Garnet M.D.   On: 07/08/2023 18:27    Microbiology: Results for orders placed or performed during the hospital encounter of 07/08/23  Resp panel by RT-PCR (RSV, Flu A&B, Covid) Anterior Nasal Swab     Status: None   Collection Time: 07/08/23  6:00 PM   Specimen: Anterior Nasal Swab  Result Value Ref Range Status   SARS Coronavirus 2 by RT PCR NEGATIVE NEGATIVE Final    Comment: (NOTE) SARS-CoV-2 target nucleic acids are  NOT DETECTED.  The SARS-CoV-2 RNA is generally detectable in upper respiratory specimens during the acute phase of infection. The lowest concentration of SARS-CoV-2 viral copies this assay can detect is 138 copies/mL. A negative result does not preclude SARS-Cov-2 infection and should not be used as the sole basis for treatment or other patient management decisions. A negative result may occur with  improper specimen collection/handling, submission of specimen other than nasopharyngeal swab, presence of viral mutation(s) within the areas targeted by this assay, and inadequate number of viral copies(<138 copies/mL). A negative result must be combined with clinical observations, patient history, and epidemiological information. The expected result is Negative.  Fact Sheet for Patients:  BloggerCourse.com  Fact Sheet for Healthcare Providers:  SeriousBroker.it  This test is no t yet approved or cleared by the [place] FDA and  has been authorized for detection and/or diagnosis of SARS-CoV-2 by FDA under an Emergency Use Authorization (EUA). This EUA will remain  in effect (meaning this test can be used) for the duration of the COVID-19 declaration under Section 564(b)(1) of the Act, 21 U.S.C.section 360bbb-3(b)(1), unless the authorization is terminated  or revoked sooner.       Influenza A by PCR NEGATIVE NEGATIVE Final   Influenza B by PCR NEGATIVE NEGATIVE Final    Comment: (NOTE) The Xpert Xpress SARS-CoV-2/FLU/RSV plus assay is intended as an aid in the diagnosis of influenza from Nasopharyngeal swab specimens and should not be used as a sole basis for treatment. Nasal washings and aspirates are unacceptable for Xpert Xpress SARS-CoV-2/FLU/RSV testing.  Fact Sheet for Patients: BloggerCourse.com  Fact Sheet for Healthcare Providers: SeriousBroker.it  This test is not  yet approved or cleared by the [place] FDA and has been authorized for detection and/or diagnosis of SARS-CoV-2 by FDA under an Emergency Use Authorization (EUA). This EUA will remain in effect (meaning this test can be used) for the duration of the COVID-19 declaration under Section 564(b)(1)  of the Act, 21 U.S.C. section 360bbb-3(b)(1), unless the authorization is terminated or revoked.     Resp Syncytial Virus by PCR NEGATIVE NEGATIVE Final    Comment: (NOTE) Fact Sheet for Patients: BloggerCourse.com  Fact Sheet for Healthcare Providers: SeriousBroker.it  This test is not yet approved or cleared by the [place] FDA and has been authorized for detection and/or diagnosis of SARS-CoV-2 by FDA under an Emergency Use Authorization (EUA). This EUA will remain in effect (meaning this test can be used) for the duration of the COVID-19 declaration under Section 564(b)(1) of the Act, 21 U.S.C. section 360bbb-3(b)(1), unless the authorization is terminated or revoked.  Performed at [facility], [address] [place], [place] [address]   Blood Culture (routine x 2)     Status: None (Preliminary result)   Collection Time: 07/08/23  7:35 PM   Specimen: BLOOD  Result Value Ref Range Status   Specimen Description BLOOD BLOOD RIGHT ARM  Final   Special Requests   Final    BOTTLES DRAWN AEROBIC AND ANAEROBIC Blood Culture results may not be optimal due to an inadequate volume of blood received in culture bottles   Culture   Final    NO GROWTH 3 DAYS Performed at [facility], [address] [place], [place] [address]    Report Status PENDING  Incomplete  Blood Culture (routine x 2)     Status: None (Preliminary result)   Collection Time: 07/08/23  7:35 PM   Specimen: BLOOD  Result Value Ref Range Status   Specimen Description BLOOD RIGHT ANTECUBITAL  Final   Special Requests   Final    BOTTLES DRAWN  AEROBIC AND ANAEROBIC Blood Culture results may not be optimal due to an inadequate volume of blood received in culture bottles   Culture   Final    NO GROWTH 3 DAYS Performed at [facility], [address] [place], [place] [address]    Report Status PENDING  Incomplete    Labs: CBC: Recent Labs  Lab 07/08/23 1800 07/09/23 0256 07/10/23 0500 07/11/23 0324  WBC 23.9* 16.8* 13.2* 8.9  HGB 12.1 10.9* 10.5* 10.5*  HCT 35.1* 30.9* 30.6* 30.1*  MCV 86.5 84.7 86.2 84.6  PLT 424* 393 438* 484*   Basic Metabolic Panel: Recent Labs  Lab 07/08/23 1800 07/09/23 0256 07/10/23 0500 07/11/23 0324  NA 138 139 141 139  K 3.4* 3.1* 3.7 3.7  CL 102 106 109 105  CO2 24 26 27 28   GLUCOSE 112* 104* 92 98  BUN 6 6 <5* 5*  CREATININE 0.64 0.65 0.72 0.58  CALCIUM 9.3 8.5* 8.8* 8.8*  MG  --  1.8  --  2.0   Liver Function Tests: Recent Labs  Lab 07/08/23 1800  AST 15  ALT 10  ALKPHOS 63  BILITOT 0.7  PROT 8.0  ALBUMIN 4.1   CBG: No results for input(s): "GLUCAP" in the last 168 hours.  Discharge time spent: {LESS THAN/GREATER BJYN:82956} 30 minutes.  Signed: Pennie Banter, DO [facility] 07/11/2023

## 2023-07-12 ENCOUNTER — Encounter: Payer: Self-pay | Admitting: Internal Medicine

## 2023-07-12 ENCOUNTER — Other Ambulatory Visit: Payer: Self-pay | Admitting: Internal Medicine

## 2023-07-12 LAB — LEGIONELLA PNEUMOPHILA SEROGP 1 UR AG: L. pneumophila Serogp 1 Ur Ag: NEGATIVE

## 2023-07-12 MED ORDER — HYDROCOD POLI-CHLORPHE POLI ER 10-8 MG/5ML PO SUER
5.0000 mL | Freq: Two times a day (BID) | ORAL | 0 refills | Status: AC | PRN
Start: 2023-07-12 — End: ?

## 2023-07-12 MED ORDER — AZITHROMYCIN 500 MG PO TABS
500.0000 mg | ORAL_TABLET | Freq: Every day | ORAL | 0 refills | Status: AC
Start: 2023-07-12 — End: 2023-07-14

## 2023-07-12 MED ORDER — PROMETHAZINE HCL 25 MG PO TABS: 25.0000 mg | ORAL_TABLET | Freq: Four times a day (QID) | ORAL | 0 refills | Status: AC | PRN

## 2023-07-12 MED ORDER — AMOXICILLIN-POT CLAVULANATE 875-125 MG PO TABS: 1.0000 | ORAL_TABLET | Freq: Two times a day (BID) | ORAL | 0 refills | Status: AC

## 2023-07-12 NOTE — Progress Notes (Signed)
 Re-sending discharge prescriptions to local pharmacy at pt's request -- [facility] informed pt something was not filled but unclear which Rx.  Will send Phenergan and antibiotics to [facility] in [place] per pt request.

## 2023-07-13 LAB — CULTURE, BLOOD (ROUTINE X 2)
Culture: NO GROWTH
Culture: NO GROWTH

## 2023-10-26 ENCOUNTER — Emergency Department: Payer: Self-pay

## 2023-10-26 ENCOUNTER — Emergency Department
Admission: EM | Admit: 2023-10-26 | Discharge: 2023-10-26 | Disposition: A | Discharge status: Home / Self Care | Payer: Self-pay | Attending: Emergency Medicine | Admitting: Emergency Medicine

## 2023-10-26 DIAGNOSIS — J189 Pneumonia, unspecified organism: Secondary | ICD-10-CM

## 2023-10-26 DIAGNOSIS — R197 Diarrhea, unspecified: Secondary | ICD-10-CM | POA: Insufficient documentation

## 2023-10-26 DIAGNOSIS — J168 Pneumonia due to other specified infectious organisms: Secondary | ICD-10-CM | POA: Insufficient documentation

## 2023-10-26 DIAGNOSIS — I10 Essential (primary) hypertension: Secondary | ICD-10-CM | POA: Insufficient documentation

## 2023-10-26 LAB — CBC WITH DIFFERENTIAL/PLATELET
Abs Immature Granulocytes: 0.03 10*3/uL (ref 0.00–0.07)
Basophils Absolute: 0.1 10*3/uL (ref 0.0–0.1)
Basophils Relative: 1 %
Eosinophils Absolute: 0.1 10*3/uL (ref 0.0–0.5)
Eosinophils Relative: 1 %
HCT: 39.4 % (ref 36.0–46.0)
Hemoglobin: 13.6 g/dL (ref 12.0–15.0)
Immature Granulocytes: 0 %
Lymphocytes Relative: 47 %
Lymphs Abs: 3.3 10*3/uL (ref 0.7–4.0)
MCH: 30 pg (ref 26.0–34.0)
MCHC: 34.5 g/dL (ref 30.0–36.0)
MCV: 87 fL (ref 80.0–100.0)
Monocytes Absolute: 0.6 10*3/uL (ref 0.1–1.0)
Monocytes Relative: 9 %
Neutro Abs: 3 10*3/uL (ref 1.7–7.7)
Neutrophils Relative %: 42 %
Platelets: 287 10*3/uL (ref 150–400)
RBC: 4.53 MIL/uL (ref 3.87–5.11)
RDW: 14.4 % (ref 11.5–15.5)
WBC: 7.2 10*3/uL (ref 4.0–10.5)
nRBC: 0 % (ref 0.0–0.2)

## 2023-10-26 LAB — RESP PANEL BY RT-PCR (RSV, FLU A&B, COVID)  RVPGX2
Influenza A by PCR: NEGATIVE
Influenza B by PCR: NEGATIVE
Resp Syncytial Virus by PCR: NEGATIVE
SARS Coronavirus 2 by RT PCR: NEGATIVE

## 2023-10-26 LAB — BASIC METABOLIC PANEL WITH GFR
Anion gap: 10 (ref 5–15)
BUN: 7 mg/dL (ref 6–20)
CO2: 27 mmol/L (ref 22–32)
Calcium: 8.9 mg/dL (ref 8.9–10.3)
Chloride: 103 mmol/L (ref 98–111)
Creatinine, Ser: 0.7 mg/dL (ref 0.44–1.00)
GFR, Estimated: >60 mL/min (ref >60)
Glucose, Bld: 122 mg/dL — ABNORMAL HIGH (ref 70–99)
Potassium: 3.1 mmol/L — ABNORMAL LOW (ref 3.5–5.1)
Sodium: 140 mmol/L (ref 135–145)

## 2023-10-26 MED ORDER — AZITHROMYCIN 250 MG PO TABS: ORAL_TABLET | ORAL | 0 refills | Status: AC

## 2023-10-26 MED ORDER — CEFUROXIME AXETIL 500 MG PO TABS
500.0000 mg | ORAL_TABLET | Freq: Once | ORAL | Status: AC
  Administered 2023-10-26: 500 mg via ORAL
  Filled 2023-10-26: qty 1

## 2023-10-26 MED ORDER — CEFUROXIME AXETIL 500 MG PO TABS: 500.0000 mg | ORAL_TABLET | Freq: Two times a day (BID) | ORAL | 0 refills | Status: AC

## 2023-10-26 MED ORDER — ONDANSETRON 4 MG PO TBDP: 4.0000 mg | ORAL_TABLET | Freq: Three times a day (TID) | ORAL | 0 refills | Status: AC | PRN

## 2023-10-26 MED ORDER — POTASSIUM CHLORIDE CRYS ER 20 MEQ PO TBCR
40.0000 meq | EXTENDED_RELEASE_TABLET | Freq: Once | ORAL | Status: AC
  Administered 2023-10-26: 40 meq via ORAL
  Filled 2023-10-26: qty 2

## 2023-10-26 NOTE — ED Provider Notes (Signed)
 [facility] Provider Note    Event Date/Time   First MD Initiated Contact with Patient 10/26/23 2112     (approximate)   History   Chief Complaint Fever   HPI  Ana Cannon is a 53 y.o. female with past medical history of hypertension and hyperlipidemia who presents to the ED complaining of fever.  Patient reports that she has been feeling generally ill for the past 3 days with subjective fevers and chills as well as cough and body aches.  She is not aware of any sick contacts, does report some nausea with decreased oral intake, but has not had any vomiting or abdominal pain.  She has had some diarrhea and reports feeling generally fatigued.     Physical Exam   Triage Vital Signs: ED Triage Vitals  Encounter Vitals Group     BP 10/26/23 1731 (!) 133/102     Systolic BP Percentile --      Diastolic BP Percentile --      Pulse Rate 10/26/23 1731 87     Resp 10/26/23 1731 18     Temp 10/26/23 1731 98.7 F (37.1 C)     Temp Source 10/26/23 1731 Oral     SpO2 10/26/23 1731 96 %     Weight 10/26/23 1732 125 lb (56.7 kg)     Height 10/26/23 1732 5\' 6"  (1.676 m)     Head Circumference --      Peak Flow --      Pain Score 10/26/23 1732 0     Pain Loc --      Pain Education --      Exclude from Growth Chart --     Most recent vital signs: Vitals:   10/26/23 1731  BP: (!) 133/102  Pulse: 87  Resp: 18  Temp: 98.7 F (37.1 C)  SpO2: 96%    Constitutional: Alert and oriented. Eyes: Conjunctivae are normal. Head: Atraumatic. Nose: No congestion/rhinnorhea. Mouth/Throat: Mucous membranes are moist.  Cardiovascular: Normal rate, regular rhythm. Grossly normal heart sounds.  2+ radial pulses bilaterally. Respiratory: Normal respiratory effort.  No retractions. Lungs CTAB. Gastrointestinal: Soft and nontender. No distention. Musculoskeletal: No lower extremity tenderness nor edema.  Neurologic:  Normal speech and language. No gross focal  neurologic deficits are appreciated.    ED Results / Procedures / Treatments   Labs (all labs ordered are listed, but only abnormal results are displayed) Labs Reviewed  BASIC METABOLIC PANEL WITH GFR - Abnormal; Notable for the following components:      Result Value   Potassium 3.1 (*)    Glucose, Bld 122 (*)    All other components within normal limits  RESP PANEL BY RT-PCR (RSV, FLU A&B, COVID)  [id number]  CBC WITH DIFFERENTIAL/PLATELET    RADIOLOGY Chest x-ray reviewed and interpreted by me with multifocal pneumonia.  PROCEDURES:  Critical Care performed: No  Procedures   MEDICATIONS ORDERED IN ED: Medications  cefUROXime (CEFTIN) tablet 500 mg (has no administration in time range)  potassium chloride  SA (KLOR-CON  M) CR tablet 40 mEq (has no administration in time range)     IMPRESSION / MDM / ASSESSMENT AND PLAN / ED COURSE  I reviewed the triage vital signs and the nursing notes.                              53 y.o. female with past medical history of hypertension and hyperlipidemia who  presents to the ED complaining of 3 days of cough, subjective fevers, nausea, and diarrhea.  Patient's presentation is most consistent with acute presentation with potential threat to life or bodily function.  Differential diagnosis includes, but is not limited to, pneumonia, viral syndrome, bronchitis, COVID-19, influenza, dehydration, electrolyte abnormality, AKI, sepsis.  Patient nontoxic-appearing and in no acute distress, vital signs are unremarkable and not concerning for sepsis.  Viral panel is negative however chest x-ray concerning for multifocal pneumonia.  Labs without significant anemia, leukocytosis, electrolyte abnormality, or AKI.  Patient is not in any respiratory distress and maintaining oxygen saturations at 96% on room air.  She is appropriate for outpatient management, was given initial dose of antibiotics here in the ED.  She was counseled to follow-up with her  PCP and to return to the ED for new or worsening symptoms, patient agrees with plan.      FINAL CLINICAL IMPRESSION(S) / ED DIAGNOSES   Final diagnoses:  Pneumonia due to infectious organism, unspecified laterality, unspecified part of lung     Rx / DC Orders   ED Discharge Orders          Ordered    cefUROXime (CEFTIN) 500 MG tablet  2 times daily with meals        10/26/23 2145    azithromycin  (ZITHROMAX  Z-PAK) 250 MG tablet        10/26/23 2145    ondansetron  (ZOFRAN -ODT) 4 MG disintegrating tablet  Every 8 hours PRN        10/26/23 2145             Note:  This document was prepared using Dragon voice recognition software and may include unintentional dictation errors.   Twilla Galea, MD 10/26/23 2157

## 2023-10-26 NOTE — ED Provider Triage Note (Signed)
 Emergency Medicine Provider Triage Evaluation Note  Ana Cannon , a 53 y.o. female  was evaluated in triage.  Pt complains of fever and malaise that began on Saturday.  Review of Systems  Positive: Fever, cough Negative: N/V/D  Physical Exam  Pulse 87   Temp 98.7 F (37.1 C) (Oral)   Resp 18   Ht 5\' 6"  (1.676 m)   Wt 56.7 kg   SpO2 96%   BMI 20.18 kg/m  Gen:   Awake, no distress   Resp:  Normal effort  MSK:   Moves extremities without difficulty  Other:    Medical Decision Making  Medically screening exam initiated at 5:33 PM.  Appropriate orders placed.  Nicholaus Bark was informed that the remainder of the evaluation will be completed by another provider, this initial triage assessment does not replace that evaluation, and the importance of remaining in the ED until their evaluation is complete.     Phyliss Breen, PA-C 10/26/23 1735

## 2023-10-26 NOTE — ED Triage Notes (Signed)
 Pt presents to the ED POV from home for fever and not feeling well since Saturday. Pt reports that she has not taken her temp at home. Pt reports clearing her throat more than normal, but denies coughing. Pt's temp 98.7 at time of triage. Pt states that she feels like she has a fever. Denies nausea/vomiting/diarrhea.

## 2023-12-10 ENCOUNTER — Emergency Department

## 2023-12-10 ENCOUNTER — Other Ambulatory Visit: Payer: Self-pay

## 2023-12-10 ENCOUNTER — Emergency Department
Admission: EM | Admit: 2023-12-10 | Discharge: 2023-12-10 | Disposition: A | Discharge status: Home / Self Care | Attending: Emergency Medicine | Admitting: Emergency Medicine

## 2023-12-10 DIAGNOSIS — E876 Hypokalemia: Secondary | ICD-10-CM | POA: Insufficient documentation

## 2023-12-10 DIAGNOSIS — F17200 Nicotine dependence, unspecified, uncomplicated: Secondary | ICD-10-CM | POA: Insufficient documentation

## 2023-12-10 DIAGNOSIS — I10 Essential (primary) hypertension: Secondary | ICD-10-CM | POA: Diagnosis not present

## 2023-12-10 DIAGNOSIS — R079 Chest pain, unspecified: Secondary | ICD-10-CM | POA: Diagnosis present

## 2023-12-10 LAB — HEPATIC FUNCTION PANEL
ALT: 13 U/L (ref 0–44)
AST: 18 U/L (ref 15–41)
Albumin: 3.7 g/dL (ref 3.5–5.0)
Alkaline Phosphatase: 81 U/L (ref 38–126)
Bilirubin, Direct: <0.1 mg/dL (ref 0.0–0.2)
Total Bilirubin: 0.6 mg/dL (ref 0.0–1.2)
Total Protein: 6.7 g/dL (ref 6.5–8.1)

## 2023-12-10 LAB — BASIC METABOLIC PANEL WITH GFR
Anion gap: 10 (ref 5–15)
BUN: 13 mg/dL (ref 6–20)
CO2: 25 mmol/L (ref 22–32)
Calcium: 9.2 mg/dL (ref 8.9–10.3)
Chloride: 107 mmol/L (ref 98–111)
Creatinine, Ser: 0.94 mg/dL (ref 0.44–1.00)
GFR, Estimated: >60 mL/min (ref >60)
Glucose, Bld: 87 mg/dL (ref 70–99)
Potassium: 3.4 mmol/L — ABNORMAL LOW (ref 3.5–5.1)
Sodium: 142 mmol/L (ref 135–145)

## 2023-12-10 LAB — LIPASE, BLOOD: Lipase: 38 U/L (ref 11–51)

## 2023-12-10 LAB — CBC
HCT: 32.9 % — ABNORMAL LOW (ref 36.0–46.0)
Hemoglobin: 11.1 g/dL — ABNORMAL LOW (ref 12.0–15.0)
MCH: 30.2 pg (ref 26.0–34.0)
MCHC: 33.7 g/dL (ref 30.0–36.0)
MCV: 89.4 fL (ref 80.0–100.0)
Platelets: 281 K/uL (ref 150–400)
RBC: 3.68 MIL/uL — ABNORMAL LOW (ref 3.87–5.11)
RDW: 14.6 % (ref 11.5–15.5)
WBC: 7.5 K/uL (ref 4.0–10.5)
nRBC: 0 % (ref 0.0–0.2)

## 2023-12-10 LAB — TROPONIN I (HIGH SENSITIVITY): Troponin I (High Sensitivity): 3 ng/L (ref <18)

## 2023-12-10 NOTE — ED Triage Notes (Signed)
 Pt reports around 10am this morning she had a near syncopal episode and then vomited, pt reports since then she has had ongoing chest pain. Pt denies known cardiac hx.

## 2023-12-10 NOTE — ED Provider Notes (Signed)
 [facility] Provider Note   Event Date/Time   First MD Initiated Contact with Patient 12/10/23 [phone]     (approximate) History  Chest Pain  HPI Ana Cannon is a 53 y.o. female with a past medical history of hypertension, hyperlipidemia, and tobacco abuse who presents for palpitations and chest discomfort with associated near syncope that occurred approximately 6 hours prior to arrival.  Patient states that she has had symptoms similar to this in the past when she has not got enough sleep after night shifts.  Patient states that this has been the case recently.  The patient denies any history of cardiac disease.  Patient denies any exertional worsening of this chest pain.  Patient denies any associated shortness of breath or dyspnea on exertion. ROS: Patient currently denies any vision changes, tinnitus, difficulty speaking, facial droop, sore throat, shortness of breath, abdominal pain, nausea/vomiting/diarrhea, dysuria, or weakness/numbness/paresthesias in any extremity   Physical Exam  Triage Vital Signs: ED Triage Vitals  Encounter Vitals Group     BP 12/10/23 0119 124/88     Girls Systolic BP Percentile --      Girls Diastolic BP Percentile --      Boys Systolic BP Percentile --      Boys Diastolic BP Percentile --      Pulse Rate 12/10/23 0119 83     Resp 12/10/23 0119 18     Temp 12/10/23 0119 98.4 F (36.9 C)     Temp src --      SpO2 12/10/23 0119 100 %     Weight 12/10/23 0119 127 lb (57.6 kg)     Height 12/10/23 0119 5' 7 (1.702 m)     Head Circumference --      Peak Flow --      Pain Score 12/10/23 0118 7     Pain Loc --      Pain Education --      Exclude from Growth Chart --    Most recent vital signs: Vitals:   12/10/23 0330 12/10/23 0400  BP: (!) 145/99 (!) 141/94  Pulse: (!) 59   Resp: 19 (!) 21  Temp:    SpO2: 100%    General: Awake, oriented x4. CV:  Good peripheral perfusion. Resp:  Normal effort. Abd:  No  distention. Other:  Middle-aged well-developed, well-nourished African-American female resting comfortably in no acute distress ED Results / Procedures / Treatments  Labs (all labs ordered are listed, but only abnormal results are displayed) Labs Reviewed  BASIC METABOLIC PANEL WITH GFR - Abnormal; Notable for the following components:      Result Value   Potassium 3.4 (*)    All other components within normal limits  CBC - Abnormal; Notable for the following components:   RBC 3.68 (*)    Hemoglobin 11.1 (*)    HCT 32.9 (*)    All other components within normal limits  HEPATIC FUNCTION PANEL  LIPASE, BLOOD  TROPONIN I (HIGH SENSITIVITY)   EKG ED ECG REPORT I, Artist MARLA Kerns, the attending physician, personally viewed and interpreted this ECG. Date: 12/10/2023 EKG Time: 0116 Rate: 79 Rhythm: normal sinus rhythm QRS Axis: normal Intervals: normal ST/T Wave abnormalities: normal Narrative Interpretation: no evidence of acute ischemia RADIOLOGY ED MD interpretation: 2 view chest x-ray interpreted by me shows no evidence of acute abnormalities including no pneumonia, pneumothorax, or widened mediastinum - All radiology independently interpreted and agree with radiology assessment Official radiology report(s): DG Chest 2  View Result Date: 12/10/2023 CLINICAL DATA:  Syncopal episode yesterday morning, with chest pain since then. EXAM: CHEST - 2 VIEW COMPARISON:  Chest PA Lat 10/26/2023. FINDINGS: The heart size and mediastinal contours are within normal limits. Both lungs are hyperinflated but clear. The visualized skeletal structures are unremarkable apart from slight thoracic kyphodextroscoliosis. IMPRESSION: Hyperinflation without evidence of acute chest disease. Electronically Signed   By: Francis Quam M.D.   On: 12/10/2023 01:44   PROCEDURES: Critical Care performed: No .1-3 Lead EKG Interpretation  Performed by: Jossie Artist POUR, MD Authorized by: Jossie Artist POUR, MD      Interpretation: normal     ECG rate:  61   ECG rate assessment: normal     Rhythm: sinus rhythm     Ectopy: none     Conduction: normal    MEDICATIONS ORDERED IN ED: Medications - No data to display IMPRESSION / MDM / ASSESSMENT AND PLAN / ED COURSE  I reviewed the triage vital signs and the nursing notes.                             The patient is on the cardiac monitor to evaluate for evidence of arrhythmia and/or significant heart rate changes. Patient's presentation is most consistent with acute presentation with potential threat to life or bodily function. Workup: ECG, CXR, CBC, BMP, Troponin Findings: ECG: No overt evidence of STEMI. No evidence of Brugada's sign, delta wave, epsilon wave, significantly prolonged QTc, or malignant arrhythmia HS Troponin: Negative x1 Other Labs unremarkable for emergent problems. CXR: Without PTX, PNA, or widened mediastinum Last Stress Test:  2020 Last Heart Catheterization:  na HEART Score: 4  Given History, Exam, and Workup I have low suspicion for ACS, Pneumothorax, Pneumonia, Pulmonary Embolus, Tamponade, Aortic Dissection or other emergent problem as a cause for this presentation.   Reassesment: Prior to discharge patient's pain was controlled and they were well appearing.  Disposition:  Discharge. Strict return precautions discussed with patient with full understanding. Advised patient to follow up promptly with primary care provider    FINAL CLINICAL IMPRESSION(S) / ED DIAGNOSES   Final diagnoses:  Nonspecific chest pain   Rx / DC Orders   ED Discharge Orders          Ordered    Ambulatory referral to Cardiology       Comments: If you have not heard from the Cardiology office within the next 72 hours please call [phone].   12/10/23 0358           Note:  This document was prepared using Dragon voice recognition software and may include unintentional dictation errors.   Renato Spellman K, MD 12/10/23 [phone]

## 2024-01-28 ENCOUNTER — Ambulatory Visit: Admitting: Internal Medicine

## 2024-01-28 NOTE — Progress Notes (Deleted)
  Cardiology Office Note:  .   Date:  01/28/2024  ID:  Ana Cannon, DOB 01/14/1971, MRN [id number] PCP: Trenda Core, MD  [facility] Providers Cardiologist:  Lonni Hanson, MD { Click to update primary MD,subspecialty MD or APP then REFRESH:1}    History of Present Illness: .   Ana Cannon is a 53 y.o. female with history of hypertension, hyperlipidemia, and tobacco abuse, who has been referred by Dr. Arvella Loss for evaluation of palpitations, chest pain, and near syncope.  She presented to the [facility] emergency department on 12/10/2023 complaining of 6 hours of chest pain, palpitations, and lightheadedness.  ED workup was reassuring with normal high-sensitivity troponin x 1.  EKG showed poor R wave progression but otherwise no significant abnormalities.  Mild hypokalemia and anemia were noted with potassium and hemoglobin of 3.4 and 11.1, respectively.  I consulted on her during a hospitalization in 2021 for chest pain.  MPI at that time was normal.  Echo demonstrated normal LVEF with grade 2 diastolic dysfunction.  Small pericardial effusion was noted.  She was treated empirically for pericarditis.  She was seen once in follow-up in 05/2019, at which time she continued to have sporadic chest pain lasting seconds to minutes not related to exertion.  It was felt that stress related to recent death of her grandchild was contributing.  NSAID and PPI were continued; additional testing was deferred.  ROS: See HPI  Studies Reviewed: .        *** Risk Assessment/Calculations:   {Does this patient have ATRIAL FIBRILLATION?:[id number]} No BP recorded.  {Refresh Note OR Click here to enter BP  :1}***       Physical Exam:   VS:  There were no vitals taken for this visit.   Wt Readings from Last 3 Encounters:  12/10/23 127 lb (57.6 kg)  10/26/23 125 lb (56.7 kg)  07/08/23 121 lb (54.9 kg)    General:  NAD. Neck: No JVD or HJR. Lungs: Clear to auscultation bilaterally without  wheezes or crackles. Heart: Regular rate and rhythm without murmurs, rubs, or gallops. Abdomen: Soft, nontender, nondistended. Extremities: No lower extremity edema.  ASSESSMENT AND PLAN: .    ***    {Are you ordering a CV Procedure (e.g. stress test, cath, DCCV, TEE, etc)?   Press F2        :[id number]}  Dispo: ***  Signed, Lonni Hanson, MD

## 2024-02-08 ENCOUNTER — Other Ambulatory Visit: Payer: Self-pay

## 2024-02-08 ENCOUNTER — Emergency Department
Admission: EM | Admit: 2024-02-08 | Discharge: 2024-02-08 | Disposition: A | Discharge status: Home / Self Care | Attending: Emergency Medicine | Admitting: Emergency Medicine

## 2024-02-08 ENCOUNTER — Emergency Department

## 2024-02-08 DIAGNOSIS — S2232XA Fracture of one rib, left side, initial encounter for closed fracture: Secondary | ICD-10-CM | POA: Diagnosis not present

## 2024-02-08 DIAGNOSIS — X500XXA Overexertion from strenuous movement or load, initial encounter: Secondary | ICD-10-CM | POA: Diagnosis not present

## 2024-02-08 DIAGNOSIS — S299XXA Unspecified injury of thorax, initial encounter: Secondary | ICD-10-CM | POA: Diagnosis present

## 2024-02-08 LAB — URINALYSIS, ROUTINE W REFLEX MICROSCOPIC
Bilirubin Urine: NEGATIVE
Glucose, UA: NEGATIVE mg/dL
Hgb urine dipstick: NEGATIVE
Ketones, ur: NEGATIVE mg/dL
Leukocytes,Ua: NEGATIVE
Nitrite: NEGATIVE
Protein, ur: NEGATIVE mg/dL
Specific Gravity, Urine: 1.011 (ref 1.005–1.030)
pH: 7 (ref 5.0–8.0)

## 2024-02-08 LAB — CBC
HCT: 34.5 % — ABNORMAL LOW (ref 36.0–46.0)
Hemoglobin: 11.8 g/dL — ABNORMAL LOW (ref 12.0–15.0)
MCH: 30.3 pg (ref 26.0–34.0)
MCHC: 34.2 g/dL (ref 30.0–36.0)
MCV: 88.7 fL (ref 80.0–100.0)
Platelets: 284 K/uL (ref 150–400)
RBC: 3.89 MIL/uL (ref 3.87–5.11)
RDW: 14 % (ref 11.5–15.5)
WBC: 7.7 K/uL (ref 4.0–10.5)
nRBC: 0 % (ref 0.0–0.2)

## 2024-02-08 LAB — LIPASE, BLOOD: Lipase: 31 U/L (ref 11–51)

## 2024-02-08 LAB — COMPREHENSIVE METABOLIC PANEL WITH GFR
ALT: 11 U/L (ref 0–44)
AST: 15 U/L (ref 15–41)
Albumin: 4.1 g/dL (ref 3.5–5.0)
Alkaline Phosphatase: 77 U/L (ref 38–126)
Anion gap: 6 (ref 5–15)
BUN: 7 mg/dL (ref 6–20)
CO2: 27 mmol/L (ref 22–32)
Calcium: 9 mg/dL (ref 8.9–10.3)
Chloride: 106 mmol/L (ref 98–111)
Creatinine, Ser: 0.84 mg/dL (ref 0.44–1.00)
GFR, Estimated: >60 mL/min (ref >60)
Glucose, Bld: 92 mg/dL (ref 70–99)
Potassium: 3.4 mmol/L — ABNORMAL LOW (ref 3.5–5.1)
Sodium: 139 mmol/L (ref 135–145)
Total Bilirubin: 0.7 mg/dL (ref 0.0–1.2)
Total Protein: 7.3 g/dL (ref 6.5–8.1)

## 2024-02-08 LAB — RESP PANEL BY RT-PCR (RSV, FLU A&B, COVID)  RVPGX2
Influenza A by PCR: NEGATIVE
Influenza B by PCR: NEGATIVE
Resp Syncytial Virus by PCR: NEGATIVE
SARS Coronavirus 2 by RT PCR: NEGATIVE

## 2024-02-08 MED ORDER — LIDOCAINE 5 % EX PTCH
1.0000 | MEDICATED_PATCH | CUTANEOUS | 0 refills | Status: AC
Start: 2024-02-08 — End: 2024-02-18

## 2024-02-08 MED ORDER — HYDROCODONE-ACETAMINOPHEN 5-325 MG PO TABS: 1.0000 | ORAL_TABLET | Freq: Four times a day (QID) | ORAL | 0 refills | Status: AC | PRN

## 2024-02-08 MED ORDER — KETOROLAC TROMETHAMINE 30 MG/ML IJ SOLN
30.0000 mg | Freq: Once | INTRAMUSCULAR | Status: AC
  Administered 2024-02-08: 30 mg via INTRAMUSCULAR
  Filled 2024-02-08: qty 1

## 2024-02-08 MED ORDER — LIDOCAINE 5 % EX PTCH
1.0000 | MEDICATED_PATCH | CUTANEOUS | Status: DC
  Administered 2024-02-08: 1 via TRANSDERMAL
  Filled 2024-02-08: qty 1

## 2024-02-08 NOTE — ED Triage Notes (Signed)
 Pt sts that she has been having abd pain with diarrhea and a runny nose. Pt sts that she has been having these problems since Sunday.

## 2024-02-08 NOTE — ED Notes (Signed)
 Pt reports pain in left anterior ribs.  Pt states she lifted a bin 3  days ago and heard a pop.  Pt states pain is worse today.  Pt has a runny nose and diarrhea.  Sx for 3-4 days.  No vomiting.  No fever.  Pt alert  speech clear.

## 2024-02-08 NOTE — ED Provider Notes (Signed)
 [facility] Provider Note    Event Date/Time   First MD Initiated Contact with Patient 02/08/24 1831     (approximate)   History   Abdominal Pain   HPI  Ana Cannon is a 53 y.o. female who presents to the emergency department today because of concerns for left sided chest pain.  The patient states that 3 nights ago she was lifting a box when she felt a pop to her left lower chest.  Since that she has had increasing discomfort and pain.  It is worse with movement.  She feels like one of her ribs is sticking out.  In addition the patient recently has had some issues with diarrhea and headache.  She is worried she might have COVID.  She denies any fevers.     Physical Exam   Triage Vital Signs: ED Triage Vitals [02/08/24 1801]  Encounter Vitals Group     BP (!) 136/96     Girls Systolic BP Percentile      Girls Diastolic BP Percentile      Boys Systolic BP Percentile      Boys Diastolic BP Percentile      Pulse Rate 81     Resp 17     Temp 98.9 F (37.2 C)     Temp Source Oral     SpO2 98 %     Weight 124 lb (56.2 kg)     Height 5' 7 (1.702 m)     Head Circumference      Peak Flow      Pain Score 9     Pain Loc      Pain Education      Exclude from Growth Chart     Most recent vital signs: Vitals:   02/08/24 1801  BP: (!) 136/96  Pulse: 81  Resp: 17  Temp: 98.9 F (37.2 C)  SpO2: 98%   General: Awake, alert, oriented. CV:  Good peripheral perfusion. Regular rate and rhythm. Resp:  Normal effort. Lungs clear. Abd:  No distention.  MSK:  Tender to palpation of the left lower chest wall.  ED Results / Procedures / Treatments   Labs (all labs ordered are listed, but only abnormal results are displayed) Labs Reviewed  COMPREHENSIVE METABOLIC PANEL WITH GFR - Abnormal; Notable for the following components:      Result Value   Potassium 3.4 (*)    All other components within normal limits  CBC - Abnormal; Notable for the  following components:   Hemoglobin 11.8 (*)    HCT 34.5 (*)    All other components within normal limits  URINALYSIS, ROUTINE W REFLEX MICROSCOPIC - Abnormal; Notable for the following components:   Color, Urine YELLOW (*)    APPearance CLEAR (*)    All other components within normal limits  RESP PANEL BY RT-PCR (RSV, FLU A&B, COVID)  RVPGX2  LIPASE, BLOOD     EKG  None   RADIOLOGY I independently interpreted and visualized the left rib series. My interpretation: No pneumothorax Radiology interpretation:  IMPRESSION:  1. Equivocal nondisplaced left lateral 10th rib fracture. Correlate for point  tenderness.      PROCEDURES:  Critical Care performed: No   MEDICATIONS ORDERED IN ED: Medications - No data to display   IMPRESSION / MDM / ASSESSMENT AND PLAN / ED COURSE  I reviewed the triage vital signs and the nursing notes.  Differential diagnosis includes, but is not limited to, pneumonia, contusion, fracture, PE  Patient's presentation is most consistent with acute presentation with potential threat to life or bodily function.   Patient presented to the emergency department today because of concerns of left-sided chest pain that started after she moved a heavy box.  On exam patient is tender in her left lower chest.  Lungs are clear.  Patient is neither hypoxic nor tachycardic.  X-ray is concerning for left sided rib fracture which explain the patient's symptoms.  She did feel better after medication and lidocaine  patch.  I discussed finding with the patient.  She states that she has anincentive inspirometer at home already.  I encourage patient to use it.  Will plan on discharging with pain medication and Lidoderm  patch.      FINAL CLINICAL IMPRESSION(S) / ED DIAGNOSES   Final diagnoses:  Closed fracture of one rib of left side, initial encounter      Note:  This document was prepared using Dragon voice recognition software and  may include unintentional dictation errors.    Floy Roberts, MD 02/08/24 2152

## 2024-02-08 NOTE — Discharge Instructions (Addendum)
 As we discussed please use your incentive spirometer (the device that encourages deep breaths).

## 2024-02-18 ENCOUNTER — Encounter: Payer: Self-pay | Admitting: Cardiovascular Disease

## 2024-02-18 ENCOUNTER — Ambulatory Visit: Attending: Cardiovascular Disease | Admitting: Cardiovascular Disease

## 2024-02-18 ENCOUNTER — Ambulatory Visit

## 2024-02-18 VITALS — BP 120/78 | HR 80 | Ht 67.0 in | Wt 118.0 lb

## 2024-02-18 DIAGNOSIS — R072 Precordial pain: Secondary | ICD-10-CM

## 2024-02-18 DIAGNOSIS — I10 Essential (primary) hypertension: Secondary | ICD-10-CM | POA: Diagnosis not present

## 2024-02-18 DIAGNOSIS — R002 Palpitations: Secondary | ICD-10-CM | POA: Diagnosis not present

## 2024-02-18 DIAGNOSIS — E785 Hyperlipidemia, unspecified: Secondary | ICD-10-CM

## 2024-02-18 DIAGNOSIS — Z720 Tobacco use: Secondary | ICD-10-CM

## 2024-02-18 MED ORDER — METOPROLOL TARTRATE 100 MG PO TABS: ORAL_TABLET | ORAL | 0 refills | Status: AC

## 2024-02-18 NOTE — Progress Notes (Signed)
 Cardiology Office Note   Date:  02/18/2024   ID:  MADISSEN WYSE, DOB Dec 17, 1970, MRN [id number]  PCP:  Trenda Core, MD  Cardiologist:   Deatrice Cage, MD   Chief Complaint  Patient presents with   New Patient (Initial Visit)    CP ED c/o rapid heart beat and pt would like to discuss pericarditis. Meds reviewed verbally with pt.      History of Present Illness: Ana Cannon is a 53 y.o. female who was referred from [facility] ED for evaluation of chest pain.  She has known history of essential hypertension, hyperlipidemia and tobacco use.  She was hospitalized in January 2021 with atypical chest pain with slightly elevated troponin.  Echocardiogram showed normal LV systolic function with no significant valvular abnormalities.  She had a Lexiscan  Myoview  that showed no evidence of ischemia.  Her chest pain was in the setting of significant stress but there was no evidence of Takotsubo.  Some of her symptoms were felt to be possibly due to pericarditis.  She was seen in the ED in July for palpitations and chest discomfort associated with near syncope.  Her cardiac workup was negative with no EKG changes and normal troponin.  Chest x-ray was unremarkable and the rest of her labs were reassuring.  She works in a [employer] and experiences exertional chest pain that last for about 5 minutes and resolved with rest.  She has frequent palpitations and tachycardia.  She smokes one quarter of a pack per day.  She has no family history of premature coronary artery disease.    Past Medical History:  Diagnosis Date   Atypical chest pain    a. 05/2019 MV: EF>65%. No ischemia/scar-->treated w/ NSAIDS - ? pericarditis.   Diastolic dysfunction    a. 05/2019 Echo: EF 65-70%, mild LVH. Gr2 DD. No rwma. Nl RV size/fxn. Sm pericardial effusion. Triv MR.   Hyperlipemia    Hypertension    Sepsis due to pneumonia (HCC) 07/08/2023   Tobacco abuse     History reviewed. No pertinent  surgical history.   Current Outpatient Medications  Medication Sig Dispense Refill   amLODipine  (NORVASC ) 5 MG tablet Take 1 tablet (5 mg total) by mouth daily. 30 tablet 0   chlorpheniramine-HYDROcodone  (TUSSIONEX) 10-8 MG/5ML Take 5 mLs by mouth every 12 (twelve) hours as needed for cough. 70 mL 0   HYDROcodone -acetaminophen  (NORCO/VICODIN) 5-325 MG tablet Take 1 tablet by mouth every 6 (six) hours as needed for severe pain (pain score 7-10). 10 tablet 0   lidocaine  (LIDODERM ) 5 % Place 1 patch onto the skin daily for 10 days. Remove & Discard patch within 12 hours or as directed by MD 10 patch 0   metoprolol  tartrate (LOPRESSOR ) 50 MG tablet Take 1 tablet (50 mg total) by mouth 2 (two) times daily. 180 tablet 3   mirtazapine  (REMERON ) 45 MG tablet Take 1 tablet (45 mg total) by mouth at bedtime. 30 tablet 0   OLANZapine  (ZYPREXA ) 2.5 MG tablet Take 2.5 mg by mouth at bedtime.     ondansetron  (ZOFRAN -ODT) 4 MG disintegrating tablet Take 1 tablet (4 mg total) by mouth every 8 (eight) hours as needed for nausea or vomiting. 12 tablet 0   pantoprazole  (PROTONIX ) 20 MG tablet Take 1 tablet (20 mg total) by mouth daily. 30 tablet 0   promethazine  (PHENERGAN ) 25 MG tablet Take 1 tablet (25 mg total) by mouth every 6 (six) hours as needed (nausea/vomiting if able to keep  down PO meds (otherwise use IV Zofran )). 30 tablet 0   valACYclovir (VALTREX) 500 MG tablet Take 500 mg by mouth as needed.     No current facility-administered medications for this visit.    Allergies:   Sertraline    Social History:  The patient  reports that she has been smoking cigarettes. She has never used smokeless tobacco. She reports that she does not currently use alcohol. She reports that she does not use drugs.   Family History:  The patient's family history includes CAD in her father and mother; Heart attack in her maternal grandmother; Heart failure in her mother; Hypertension in her father.    ROS:  Please see  the history of present illness.   Otherwise, review of systems are positive for none.   All other systems are reviewed and negative.    PHYSICAL EXAM: VS:  BP 120/78 (BP Location: Right Arm, Patient Position: Sitting, Cuff Size: Normal)   Pulse 80   Ht 5' 7 (1.702 m)   Wt 118 lb (53.5 kg)   LMP  (LMP Unknown) Comment: Depo Provera  SpO2 99%   BMI 18.48 kg/m  , BMI Body mass index is 18.48 kg/m. GEN: Well nourished, well developed, in no acute distress  HEENT: normal  Neck: no JVD, carotid bruits, or masses Cardiac: RRR; no murmurs, rubs, or gallops,no edema  Respiratory:  clear to auscultation bilaterally, normal work of breathing GI: soft, nontender, nondistended, + BS MS: no deformity or atrophy  Skin: warm and dry, no rash Neuro:  Strength and sensation are intact Psych: euthymic mood, full affect   EKG:  EKG is ordered today. The ekg ordered today demonstrates : Normal sinus rhythm Normal ECG       Recent Labs: 07/11/2023: Magnesium  2.0 02/08/2024: ALT 11; BUN 7; Creatinine, Ser 0.84; Hemoglobin 11.8; Platelets 284; Potassium 3.4; Sodium 139    Lipid Panel    Component Value Date/Time   CHOL 180 05/23/2019 1149   TRIG 65 05/23/2019 1149   HDL 45 05/23/2019 1149   CHOLHDL 4.0 05/23/2019 1149   VLDL 13 05/23/2019 1149   LDLCALC 122 (H) 05/23/2019 1149      Wt Readings from Last 3 Encounters:  02/18/24 118 lb (53.5 kg)  02/08/24 124 lb (56.2 kg)  12/10/23 127 lb (57.6 kg)          02/18/2024    9:46 AM 02/18/2024    9:36 AM  PAD Screen  Previous PAD dx? No No  Previous surgical procedure? No No  Pain with walking? No Yes  Subsides with rest? No Yes  Feet/toe relief with dangling? No No  Painful, non-healing ulcers? No No  Extremities discolored? No No      ASSESSMENT AND PLAN:  1.  Exertional chest pain concerning for angina with multiple risk factors for coronary artery disease.  Recommend evaluation with cardiac CTA.  2.  Palpitations and  tachycardia: Her baseline EKG is unremarkable.  I requested a 2-week ZIO monitor.  3.  Essential hypertension: Blood pressure is controlled on amlodipine .  4.  Hyperlipidemia: Currently not on treatment with most recent LDL of 141.  If cardiac CTA shows significant coronary artery disease, we will have to start her on a statin.  5.  Tobacco use: I discussed with her the importance of smoking cessation.  She is currently being treated for depression given prolonged grief after her father died last year.    Disposition:   FU in 6 weeks.  Signed,  Deatrice Cage, MD  02/18/2024 10:05 AM    [facility]

## 2024-02-18 NOTE — Patient Instructions (Addendum)
 Medication Instructions:  No changes *If you need a refill on your cardiac medications before your next appointment, please call your pharmacy*  Lab Work: None ordered If you have labs (blood work) drawn today and your tests are completely normal, you will receive your results only by: MyChart Message (if you have MyChart) OR A paper copy in the mail If you have any lab test that is abnormal or we need to change your treatment, we will call you to review the results.  Follow-Up: At [facility], you and your health needs are our priority.  As part of our continuing mission to provide you with exceptional heart care, our providers are all part of one team.  This team includes your primary Cardiologist (physician) and Advanced Practice Providers or APPs (Physician Assistants and Nurse Practitioners) who all work together to provide you with the care you need, when you need it.  Your next appointment:   6 week(s)  Provider:   You will see one of the following Advanced Practice Providers on your designated Care Team:   Lonni Meager, NP Lesley Maffucci, PA-C Bernardino Bring, PA-C Cadence Middleport, PA-C Tylene Lunch, NP Barnie Hila, NP  We recommend signing up for the patient portal called MyChart.  Sign up information is provided on this After Visit Summary.  MyChart is used to connect with patients for Virtual Visits (Telemedicine).  Patients are able to view lab/test results, encounter notes, upcoming appointments, etc.  Non-urgent messages can be sent to your provider as well.   To learn more about what you can do with MyChart, go to [email or web address].   Other Instructions   Your cardiac CT will be scheduled at one of the below locations:    [facility] [address] [place], [place] [address] [phone]   If scheduled at [facility], please arrive to the Heart and Vascular Center 15 mins early for check-in and  test prep.  There is spacious parking and easy access to the radiology department from the [facility] Heart and V[facility] entrance. Please enter here and check-in with the desk attendant.   Please follow these instructions carefully (unless otherwise directed):  An IV will be required for this test and Nitroglycerin will be given.   On the Night Before the Test: Be sure to Drink plenty of water. Do not consume any caffeinated/decaffeinated beverages or chocolate 12 hours prior to your test. Do not take any antihistamines 12 hours prior to your test.  On the Day of the Test: Drink plenty of water until 1 hour prior to the test. Do not eat any food 1 hour prior to test. You may take your regular medications prior to the test.  Take 100mg  metoprolol  (Lopressor ) two hours prior to test. Hold the Amlodipine  If you take Furosemide/Hydrochlorothiazide /Spironolactone/Chlorthalidone, please HOLD on the morning of the test. Patients who wear a continuous glucose monitor MUST remove the device prior to scanning. FEMALES- please wear underwire-free bra if available, avoid dresses & tight clothing  After the Test: Drink plenty of water. After receiving IV contrast, you may experience a mild flushed feeling. This is normal. On occasion, you may experience a mild rash up to 24 hours after the test. This is not dangerous. If this occurs, you can take Benadryl 25 mg, Zyrtec, Claritin, or Allegra and increase your fluid intake. (Patients taking Tikosyn should avoid Benadryl, and may take Zyrtec, Claritin, or Allegra) If you experience trouble breathing, this can be serious. If it  is severe call 911 IMMEDIATELY. If it is mild, please call our office.  We will call to schedule your test 2-4 weeks out understanding that some insurance companies will need an authorization prior to the service being performed.   For more information and frequently asked questions, please visit our website :  [email or web address]  For non-scheduling related questions, please contact the cardiac imaging nurse navigator should you have any questions/concerns: Cardiac Imaging Nurse Navigators Direct Office Dial: [phone]   For scheduling needs, including cancellations and rescheduling, please call [place], [phone].  ZIO XT- Long Term Monitor Instructions  Your physician has requested you wear a ZIO patch monitor for 14 days.  This is a single patch monitor. [employer] supplies one patch monitor per enrollment. Additional stickers are not available. Please do not apply patch if you will be having a Nuclear Stress Test,  Echocardiogram, Cardiac CT, MRI, or Chest Xray during the period you would be wearing the  monitor. The patch cannot be worn during these tests. You cannot remove and re-apply the  ZIO XT patch monitor.  Your ZIO patch monitor will be mailed 3 day USPS to your address on file. It may take 3-5 days  to receive your monitor after you have been enrolled.  Once you have received your monitor, please review the enclosed instructions. Your monitor  has already been registered assigning a specific monitor serial # to you.  Billing and Patient Assistance Program Information  We have supplied [employer] with any of your insurance information on file for billing purposes. [employer] offers a sliding scale Patient Assistance Program for patients that do not have  insurance, or whose insurance does not completely cover the cost of the ZIO monitor.  You must apply for the Patient Assistance Program to qualify for this discounted rate.  To apply, please call [employer] at [phone], select option 4, select option 2, ask to apply for  Patient Assistance Program. Meredeth will ask your household income, and how many people  are in your household. They will quote your out-of-pocket cost based on that information.  [employer] will also be able to set up a 9-month, interest-free payment  plan if needed.  Applying the monitor   Shave hair from upper left chest.  Hold abrader disc by orange tab. Rub abrader in 40 strokes over the upper left chest as  indicated in your monitor instructions.  Clean area with 4 enclosed alcohol pads. Let dry.  Apply patch as indicated in monitor instructions. Patch will be placed under collarbone on left  side of chest with arrow pointing upward.  Rub patch adhesive wings for 2 minutes. Remove white label marked 1. Remove the white  label marked 2. Rub patch adhesive wings for 2 additional minutes.  While looking in a mirror, press and release button in center of patch. A small green light will  flash 3-4 times. This will be your only indicator that the monitor has been turned on.  Do not shower for the first 24 hours. You may shower after the first 24 hours.  Press the button if you feel a symptom. You will hear a small click. Record Date, Time and  Symptom in the Patient Logbook.  When you are ready to remove the patch, follow instructions on the last 2 pages of Patient  Logbook. Stick patch monitor onto the last page of Patient Logbook.  Place Patient Logbook in the blue and white box. Use locking tab on box and tape box closed  securely.  The blue and white box has prepaid postage on it. Please place it in the mailbox as  soon as possible. Your physician should have your test results approximately 7 days after the  monitor has been mailed back to [facility].  Call [facility] Customer Care at [phone] if you have questions regarding  your ZIO XT patch monitor. Call them immediately if you see an orange light blinking on your  monitor.  If your monitor falls off in less than 4 days, contact our Monitor department at [phone].  If your monitor becomes loose or falls off after 4 days call [employer] at [phone] for  suggestions on securing your monitor     Managing the Challenge of Quitting Smoking Quitting smoking  is a physical and mental challenge. You may have cravings, withdrawal symptoms, and temptation to smoke. Before quitting, work with your health care provider to make a plan that can help you manage quitting. Making a plan before you quit may keep you from smoking when you have the urge to smoke while trying to quit. How to manage lifestyle changes Managing stress Stress can make you want to smoke, and wanting to smoke may cause stress. It is important to find ways to manage your stress. You could try some of the following: Practice relaxation techniques. Breathe slowly and deeply, in through your nose and out through your mouth. Listen to music. Soak in a bath or take a shower. Imagine a peaceful place or vacation. Get some support. Talk with family or friends about your stress. Join a support group. Talk with a counselor or therapist. Get some physical activity. Go for a walk, run, or bike ride. Play a favorite sport. Practice yoga.  Medicines Talk with your health care provider about medicines that might help you deal with cravings and make quitting easier for you. Relationships Social situations can be difficult when you are quitting smoking. To manage this, you can: Avoid parties and other social situations where people might be smoking. Avoid alcohol. Leave right away if you have the urge to smoke. Explain to your family and friends that you are quitting smoking. Ask for support and let them know you might be a bit grumpy. Plan activities where smoking is not an option. General instructions Be aware that many people gain weight after they quit smoking. However, not everyone does. To keep from gaining weight, have a plan in place before you quit, and stick to the plan after you quit. Your plan should include: Eating healthy snacks. When you have a craving, it may help to: Eat popcorn, or try carrots, celery, or other cut vegetables. Chew sugar-free gum. Changing how you eat. Eat  small portion sizes at meals. Eat 4-6 small meals throughout the day instead of 1-2 large meals a day. Be mindful when you eat. You should avoid watching television or doing other things that might distract you as you eat. Exercising regularly. Make time to exercise each day. If you do not have time for a long workout, do short bouts of exercise for 5-10 minutes several times a day. Do some form of strengthening exercise, such as weight lifting. Do some exercise that gets your heart beating and causes you to breathe deeply, such as walking fast, running, swimming, or biking. This is very important. Drinking plenty of water or other low-calorie or no-calorie drinks. Drink enough fluid to keep your urine pale yellow.  How to recognize withdrawal symptoms Your body and mind may experience discomfort as you  try to get used to not having nicotine in your system. These effects are called withdrawal symptoms. They may include: Feeling hungrier than normal. Having trouble concentrating. Feeling irritable or restless. Having trouble sleeping. Feeling depressed. Craving a cigarette. These symptoms may surprise you, but they are normal to have when quitting smoking. To manage withdrawal symptoms: Avoid places, people, and activities that trigger your cravings. Remember why you want to quit. Get plenty of sleep. Avoid coffee and other drinks that contain caffeine. These may worsen some of your symptoms. How to manage cravings Come up with a plan for how to deal with your cravings. The plan should include the following: A definition of the specific situation you want to deal with. An activity or action you will take to replace smoking. A clear idea for how this action will help. The name of someone who could help you with this. Cravings usually last for 5-10 minutes. Consider taking the following actions to help you with your plan to deal with cravings: Keep your mouth busy. Chew sugar-free  gum. Suck on hard candies or a straw. Brush your teeth. Keep your hands and body busy. Change to a different activity right away. Squeeze or play with a ball. Do an activity or a hobby, such as making bead jewelry, practicing needlepoint, or working with wood. Mix up your normal routine. Take a short exercise break. Go for a quick walk, or run up and down stairs. Focus on doing something kind or helpful for someone else. Call a friend or family member to talk during a craving. Join a support group. Contact a quitline. Where to find support To get help or find a support group: Call the National Cancer Institute's Smoking Quitline: 1-800-QUIT-NOW 919-769-0191) Text QUIT to SmokefreeTXT: 521151 Where to find more information Visit these websites to find more information on quitting smoking: U.S. Department of Health and Human Services: www.smokefree.gov American Lung Association: www.freedomfromsmoking.org Centers for Disease Control and Prevention (CDC): FootballExhibition.com.br American Heart Association: www.heart.org Contact a health care provider if: You want to change your plan for quitting. The medicines you are taking are not helping. Your eating feels out of control or you cannot sleep. You feel depressed or become very anxious. Summary Quitting smoking is a physical and mental challenge. You will face cravings, withdrawal symptoms, and temptation to smoke again. Preparation can help you as you go through these challenges. Try different techniques to manage stress, handle social situations, and prevent weight gain. You can deal with cravings by keeping your mouth busy (such as by chewing gum), keeping your hands and body busy, calling family or friends, or contacting a quitline for people who want to quit smoking. You can deal with withdrawal symptoms by avoiding places where people smoke, getting plenty of rest, and avoiding drinks that contain caffeine. This information is not intended to  replace advice given to you by your health care provider. Make sure you discuss any questions you have with your health care provider. Document Revised: 04/25/2021 Document Reviewed: 04/25/2021 Elsevier Patient Education  2024 [employer].

## 2024-03-07 ENCOUNTER — Encounter (HOSPITAL_COMMUNITY): Payer: Self-pay

## 2024-03-08 ENCOUNTER — Telehealth (HOSPITAL_COMMUNITY): Payer: Self-pay | Admitting: Emergency Medicine

## 2024-03-08 NOTE — Telephone Encounter (Signed)
 Attempted to call patient regarding upcoming cardiac CT appointment. Left message on voicemail with name and callback number Rockwell Alexandria RN Navigator Cardiac Imaging [facility] [phone] Office [phone] Cell

## 2024-03-09 ENCOUNTER — Ambulatory Visit: Admission: RE | Admit: 2024-03-09 | Source: Ambulatory Visit

## 2024-03-31 ENCOUNTER — Ambulatory Visit: Payer: Self-pay | Admitting: Nurse Practitioner

## 2024-04-06 ENCOUNTER — Ambulatory Visit: Admitting: Cardiovascular Disease

## 2024-04-18 ENCOUNTER — Encounter (HOSPITAL_COMMUNITY): Payer: Self-pay

## 2024-04-19 ENCOUNTER — Telehealth (HOSPITAL_COMMUNITY): Payer: Self-pay | Admitting: Emergency Medicine

## 2024-04-19 NOTE — Telephone Encounter (Signed)
 Attempted to call patient regarding upcoming cardiac CT appointment. Left message on voicemail with name and callback number Rockwell Alexandria RN Navigator Cardiac Imaging [facility] [phone] Office [phone] Cell

## 2024-04-20 ENCOUNTER — Ambulatory Visit: Admission: RE | Admit: 2024-04-20 | Payer: Self-pay | Source: Ambulatory Visit

## 2024-04-26 ENCOUNTER — Ambulatory Visit: Payer: Self-pay | Admitting: Nurse Practitioner

## 2024-04-26 ENCOUNTER — Encounter (HOSPITAL_COMMUNITY): Payer: Self-pay

## 2024-04-26 NOTE — Progress Notes (Deleted)
 Cardiology Office Note:    Date:  04/26/2024   ID:  Ana Cannon, DOB 1971-04-29, MRN [id number]  PCP:  Trenda Core, MD   [facility] Providers Cardiologist:  Lonni Hanson, MD { Click to update primary MD,subspecialty MD or APP then REFRESH:1}    Referring MD: Trenda Core, MD   Chief complaint: Follow up of chest pain     History of Present Illness:   Ana Cannon is a 53 y.o. female with a hx of HTN, HLD, tobacco use, presenting for follow up of chest pain.  She was hospitalized in January 2021 with atypical chest pain with slightly elevated troponin.  Echocardiogram showed normal LV systolic function with no significant valvular abnormalities.  She had a Lexiscan  Myoview  that showed no evidence of ischemia.  Her chest pain was in the setting of significant stress but there was no evidence of Takotsubo.  Some of her symptoms were felt to be possibly due to pericarditis.   She was seen in the ED in July for palpitations and chest discomfort associated with near syncope.  Her cardiac workup was negative with no EKG changes and normal troponin.  Chest x-ray was unremarkable and the rest of her labs were reassuring.   She was last seen in the cardiology office in October 2025 reporting exertional CP. Coronary CTA and cardiac event monitor were ordered, not completed.     ROS:   Please see the history of present illness.    *** All other systems reviewed and are negative.     Past Medical History:  Diagnosis Date   Atypical chest pain    a. 05/2019 MV: EF>65%. No ischemia/scar-->treated w/ NSAIDS - ? pericarditis.   Diastolic dysfunction    a. 05/2019 Echo: EF 65-70%, mild LVH. Gr2 DD. No rwma. Nl RV size/fxn. Sm pericardial effusion. Triv MR.   Hyperlipemia    Hypertension    Sepsis due to pneumonia (HCC) 07/08/2023   Tobacco abuse     No past surgical history on file.  Current Medications: No outpatient medications have been marked as taking  for the 04/26/24 encounter (Appointment) with Vivienne Lonni Ingle, NP.     Allergies:   Sertraline   Social History   Socioeconomic History   Marital status: Single    Spouse name: Not on file   Number of children: Not on file   Years of education: Not on file   Highest education level: Not on file  Occupational History   Not on file  Tobacco Use   Smoking status: Every Day    Current packs/day: 0.50    Types: Cigarettes   Smokeless tobacco: Never  Vaping Use   Vaping status: Never Used  Substance and Sexual Activity   Alcohol use: Not Currently   Drug use: No   Sexual activity: Not on file  Other Topics Concern   Not on file  Social History Narrative   Not on file   Social Drivers of Health   Financial Resource Strain: Low Risk (05/01/2021)   Received from [facility]   Overall Financial Resource Strain (CARDIA)    Difficulty of Paying Living Expenses: Not hard at all  Food Insecurity: Food Insecurity Present (07/09/2023)   Hunger Vital Sign    Worried About Running Out of Food in the Last Year: Sometimes true    Ran Out of Food in the Last Year: Never true  Transportation Needs: No Transportation Needs (07/09/2023)   PRAPARE - Transportation  Lack of Transportation (Medical): No    Lack of Transportation (Non-Medical): No  Physical Activity: Sufficiently Active (04/23/2021)   Received from [facility]   Exercise Vital Sign    On average, how many days per week do you engage in moderate to strenuous exercise (like a brisk walk)?: 7 days    On average, how many minutes do you engage in exercise at this level?: 50 min  Stress: Stress Concern Present (04/23/2021)   Received from [facility] - Occupational Stress Questionnaire    Feeling of Stress : Very much  Social Connections: Moderately Isolated (07/09/2023)   Social Connection and Isolation Panel    Frequency of Communication with Friends and Family: More  than three times a week    Frequency of Social Gatherings with Friends and Family: Twice a week    Attends Religious Services: 1 to 4 times per year    Active Member of [employer] or Organizations: No    Attends Engineer, Structural: Never    Marital Status: Never married     Family History: The patient's ***family history includes CAD in her father and mother; Heart attack in her maternal grandmother; Heart failure in her mother; Hypertension in her father.  EKGs/Labs/Other Studies Reviewed:    The following studies were reviewed today: ***      Recent Labs: 07/11/2023: Magnesium  2.0 02/08/2024: ALT 11; BUN 7; Creatinine, Ser 0.84; Hemoglobin 11.8; Platelets 284; Potassium 3.4; Sodium 139  Recent Lipid Panel    Component Value Date/Time   CHOL 180 05/23/2019 1149   TRIG 65 05/23/2019 1149   HDL 45 05/23/2019 1149   CHOLHDL 4.0 05/23/2019 1149   VLDL 13 05/23/2019 1149   LDLCALC 122 (H) 05/23/2019 1149     Risk Assessment/Calculations:   {Does this patient have ATRIAL FIBRILLATION?:[phone]}  No BP recorded.  {Refresh Note OR Click here to enter BP  :1}***         Physical Exam:    VS:  LMP  (LMP Unknown) Comment: Depo Provera       Wt Readings from Last 3 Encounters:  02/18/24 118 lb (53.5 kg)  02/08/24 124 lb (56.2 kg)  12/10/23 127 lb (57.6 kg)     GEN: *** Well nourished, well developed in no acute distress HEENT: Normal NECK: No JVD; No carotid bruits CARDIAC: *** S1-S2 normal, RRR, no murmurs, rubs, gallops RESPIRATORY:  Clear to auscultation without rales, wheezing or rhonchi  MUSCULOSKELETAL:  No edema; No deformity  SKIN: Warm and dry NEUROLOGIC:  Alert and oriented x 3 PSYCHIATRIC:  Normal affect       Assessment & Plan        {Are you ordering a CV Procedure (e.g. stress test, cath, DCCV, TEE, etc)?   Press F2        :[id number]}    Medication Adjustments/Labs and Tests Ordered: Current medicines are reviewed at length with the  patient today.  Concerns regarding medicines are outlined above.  No orders of the defined types were placed in this encounter.  No orders of the defined types were placed in this encounter.   There are no Patient Instructions on file for this visit.   Signed, Miriam FORBES Shams, NP  04/26/2024 6:26 AM    [facility]

## 2024-04-27 NOTE — Progress Notes (Deleted)
 " Cardiology Office Note:    Date:  04/27/2024   ID:  Ana Cannon, DOB [birth year], MRN [id number]  PCP:  Trenda Core, MD   [facility] Providers Cardiologist:  Lonni Hanson, MD Cardiology APP:  Vivienne Lonni Ingle, NP { Click to update primary MD,subspecialty MD or APP then REFRESH:1}    Referring MD: Trenda Core, MD   Chief complaint: Follow-up of chest pain     History of Present Illness:   Ana Cannon is a 53 y.o. female with a hx of HTN, HLD, tobacco use, presenting for follow up of chest pain.  She was hospitalized in January 2021 with atypical chest pain with slightly elevated troponin.  Echocardiogram showed normal LV systolic function with no significant valvular abnormalities.  She had a Lexiscan  Myoview  that showed no evidence of ischemia.  Her chest pain was in the setting of significant stress but there was no evidence of Takotsubo.  Some of her symptoms were felt to be possibly due to pericarditis.   She was seen in the ED in July for palpitations and chest discomfort associated with near syncope.  Her cardiac workup was negative with no EKG changes and normal troponin.  Chest x-ray was unremarkable and the rest of her labs were reassuring.   She was last seen in the cardiology office in October 2025 reporting exertional CP. Coronary CTA and cardiac event monitor were ordered, not completed.   Exertional angina  Palpitations  HTN  HLD  Tobacco use  ROS:   Please see the history of present illness.    *** All other systems reviewed and are negative.     Past Medical History:  Diagnosis Date   Atypical chest pain    a. 05/2019 MV: EF>65%. No ischemia/scar-->treated w/ NSAIDS - ? pericarditis.   Diastolic dysfunction    a. 05/2019 Echo: EF 65-70%, mild LVH. Gr2 DD. No rwma. Nl RV size/fxn. Sm pericardial effusion. Triv MR.   Hyperlipemia    Hypertension    Sepsis due to pneumonia (HCC) 07/08/2023   Tobacco abuse     No  past surgical history on file.  Current Medications: Active Medications[1]   Allergies:   Sertraline   Social History   Socioeconomic History   Marital status: Single    Spouse name: Not on file   Number of children: Not on file   Years of education: Not on file   Highest education level: Not on file  Occupational History   Not on file  Tobacco Use   Smoking status: Every Day    Current packs/day: 0.50    Types: Cigarettes   Smokeless tobacco: Never  Vaping Use   Vaping status: Never Used  Substance and Sexual Activity   Alcohol use: Not Currently   Drug use: No   Sexual activity: Not on file  Other Topics Concern   Not on file  Social History Narrative   Not on file   Social Drivers of Health   Tobacco Use: High Risk (02/18/2024)   Patient History    Smoking Tobacco Use: Every Day    Smokeless Tobacco Use: Never    Passive Exposure: Not on file  Financial Resource Strain: Low Risk (05/01/2021)   [facility]   Overall Financial Resource Strain (CARDIA)    Difficulty of Paying Living Expenses: Not hard at all  Food Insecurity: Food Insecurity Present (07/09/2023)   Hunger Vital Sign    Worried About Running Out of Food  in the Last Year: Sometimes true    Ran Out of Food in the Last Year: Never true  Transportation Needs: No Transportation Needs (07/09/2023)   PRAPARE - Administrator, Civil Service (Medical): No    Lack of Transportation (Non-Medical): No  Physical Activity: Sufficiently Active (04/23/2021)   Received from [facility]   Exercise Vital Sign    On average, how many days per week do you engage in moderate to strenuous exercise (like a brisk walk)?: 7 days    On average, how many minutes do you engage in exercise at this level?: 50 min  Stress: Stress Concern Present (04/23/2021)   Received from [facility] - Occupational Stress Questionnaire    Feeling of Stress : Very much   Social Connections: Moderately Isolated (07/09/2023)   Social Connection and Isolation Panel    Frequency of Communication with Friends and Family: More than three times a week    Frequency of Social Gatherings with Friends and Family: Twice a week    Attends Religious Services: 1 to 4 times per year    Active Member of [employer] or Organizations: No    Attends Banker Meetings: Never    Marital Status: Never married  Depression (PHQ2-9): Not on file  Alcohol Screen: Not on file  Housing: Low Risk (07/09/2023)   Housing Stability Vital Sign    Unable to Pay for Housing in the Last Year: No    Number of Times Moved in the Last Year: 0    Homeless in the Last Year: No  Utilities: Not At Risk (07/09/2023)   [facility] Utilities    Threatened with loss of utilities: No  Health Literacy: Low Risk (06/15/2023)   Received from [facility]    How often do you need to have someone help you when you read instructions, pamphlets, or other written material from your doctor or pharmacy?: Never     Family History: The patient's ***family history includes CAD in her father and mother; Heart attack in her maternal grandmother; Heart failure in her mother; Hypertension in her father.  EKGs/Labs/Other Studies Reviewed:    The following studies were reviewed today: ***      Recent Labs: 07/11/2023: Magnesium  2.0 02/08/2024: ALT 11; BUN 7; Creatinine, Ser 0.84; Hemoglobin 11.8; Platelets 284; Potassium 3.4; Sodium 139  Recent Lipid Panel    Component Value Date/Time   CHOL 180 05/23/2019 1149   TRIG 65 05/23/2019 1149   HDL 45 05/23/2019 1149   CHOLHDL 4.0 05/23/2019 1149   VLDL 13 05/23/2019 1149   LDLCALC 122 (H) 05/23/2019 1149     Risk Assessment/Calculations:   {Does this patient have ATRIAL FIBRILLATION?:[phone]}  No BP recorded.  {Refresh Note OR Click here to enter BP  :1}***         Physical Exam:    VS:  LMP  (LMP Unknown) Comment: Depo Provera        Wt Readings from Last 3 Encounters:  02/18/24 118 lb (53.5 kg)  02/08/24 124 lb (56.2 kg)  12/10/23 127 lb (57.6 kg)     GEN: *** Well nourished, well developed in no acute distress HEENT: Normal NECK: No JVD; No carotid bruits CARDIAC: *** S1-S2 normal, RRR, no murmurs, rubs, gallops RESPIRATORY:  Clear to auscultation without rales, wheezing or rhonchi  MUSCULOSKELETAL:  No edema; No deformity  SKIN: Warm and dry NEUROLOGIC:  Alert and oriented  x 3 PSYCHIATRIC:  Normal affect       Assessment & Plan   Disposition: *** Route to primary cardiologist      {Are you ordering a CV Procedure (e.g. stress test, cath, DCCV, TEE, etc)?   Press F2        :[id number]}    Medication Adjustments/Labs and Tests Ordered: Current medicines are reviewed at length with the patient today.  Concerns regarding medicines are outlined above.  No orders of the defined types were placed in this encounter.  No orders of the defined types were placed in this encounter.   There are no Patient Instructions on file for this visit.   Signed, Lajuana Patchell E Rodina Pinales, NP  04/27/2024 8:00 PM     [facility]     [1]  No outpatient medications have been marked as taking for the 04/28/24 encounter (Appointment) with Vivienne Lonni Ingle, NP.   "

## 2024-04-28 ENCOUNTER — Ambulatory Visit: Payer: Self-pay | Admitting: Nurse Practitioner
